# Patient Record
Sex: Female | Born: 1945
Health system: Southern US, Community
[De-identification: ages and names within clinical notes are randomized; demographics above are authoritative.]

## PROBLEM LIST (undated history)

## (undated) DIAGNOSIS — M1990 Unspecified osteoarthritis, unspecified site: Secondary | ICD-10-CM

## (undated) DIAGNOSIS — I10 Essential (primary) hypertension: Secondary | ICD-10-CM

## (undated) DIAGNOSIS — Z86010 Personal history of colonic polyps: Secondary | ICD-10-CM

## (undated) HISTORY — DX: Personal history of colonic polyps: Z86.010

## (undated) HISTORY — DX: Essential (primary) hypertension: I10

## (undated) HISTORY — DX: Unspecified osteoarthritis, unspecified site: M19.90

## (undated) HISTORY — PX: JOINT REPLACEMENT: SHX530

## (undated) HISTORY — PX: TUBAL LIGATION: SHX77

---

## 2017-04-14 DIAGNOSIS — H35423 Microcystoid degeneration of retina, bilateral: Secondary | ICD-10-CM | POA: Diagnosis not present

## 2017-08-22 DIAGNOSIS — R5383 Other fatigue: Secondary | ICD-10-CM | POA: Diagnosis not present

## 2017-08-22 DIAGNOSIS — R030 Elevated blood-pressure reading, without diagnosis of hypertension: Secondary | ICD-10-CM | POA: Diagnosis not present

## 2017-08-22 DIAGNOSIS — E7800 Pure hypercholesterolemia, unspecified: Secondary | ICD-10-CM | POA: Diagnosis not present

## 2017-08-25 DIAGNOSIS — Z1231 Encounter for screening mammogram for malignant neoplasm of breast: Secondary | ICD-10-CM | POA: Diagnosis not present

## 2017-08-30 DIAGNOSIS — Z0001 Encounter for general adult medical examination with abnormal findings: Secondary | ICD-10-CM | POA: Diagnosis not present

## 2017-09-29 DIAGNOSIS — K635 Polyp of colon: Secondary | ICD-10-CM | POA: Diagnosis not present

## 2017-09-29 DIAGNOSIS — Z1211 Encounter for screening for malignant neoplasm of colon: Secondary | ICD-10-CM | POA: Diagnosis not present

## 2017-09-29 DIAGNOSIS — D123 Benign neoplasm of transverse colon: Secondary | ICD-10-CM | POA: Diagnosis not present

## 2018-02-28 DIAGNOSIS — E7800 Pure hypercholesterolemia, unspecified: Secondary | ICD-10-CM | POA: Diagnosis not present

## 2018-03-02 LAB — LIPID PANEL
Cholesterol: 226 — AB (ref 0–200)
HDL: 57 (ref 35–70)
LDL Cholesterol: 153
Triglycerides: 79 (ref 40–160)

## 2018-03-15 DIAGNOSIS — E7800 Pure hypercholesterolemia, unspecified: Secondary | ICD-10-CM | POA: Diagnosis not present

## 2018-09-14 ENCOUNTER — Ambulatory Visit: Payer: Self-pay | Admitting: Osteopathic Medicine

## 2018-09-20 ENCOUNTER — Ambulatory Visit (INDEPENDENT_AMBULATORY_CARE_PROVIDER_SITE_OTHER): Payer: Medicare Other | Admitting: Osteopathic Medicine

## 2018-09-20 ENCOUNTER — Encounter: Payer: Self-pay | Admitting: Osteopathic Medicine

## 2018-09-20 VITALS — BP 163/85 | HR 69 | Temp 97.5°F | Ht 60.0 in | Wt 147.8 lb

## 2018-09-20 DIAGNOSIS — I10 Essential (primary) hypertension: Secondary | ICD-10-CM | POA: Diagnosis not present

## 2018-09-20 DIAGNOSIS — Z1239 Encounter for other screening for malignant neoplasm of breast: Secondary | ICD-10-CM | POA: Diagnosis not present

## 2018-09-20 DIAGNOSIS — Z860100 Personal history of colon polyps, unspecified: Secondary | ICD-10-CM

## 2018-09-20 DIAGNOSIS — R030 Elevated blood-pressure reading, without diagnosis of hypertension: Secondary | ICD-10-CM | POA: Insufficient documentation

## 2018-09-20 HISTORY — DX: Personal history of colonic polyps: Z86.010

## 2018-09-20 HISTORY — DX: Personal history of colon polyps, unspecified: Z86.0100

## 2018-09-20 LAB — COMPLETE METABOLIC PANEL WITH GFR
AG Ratio: 1.8 (calc) (ref 1.0–2.5)
ALKALINE PHOSPHATASE (APISO): 70 U/L (ref 33–130)
ALT: 18 U/L (ref 6–29)
AST: 18 U/L (ref 10–35)
Albumin: 4.2 g/dL (ref 3.6–5.1)
BILIRUBIN TOTAL: 0.6 mg/dL (ref 0.2–1.2)
BUN: 18 mg/dL (ref 7–25)
CO2: 27 mmol/L (ref 20–32)
Calcium: 9.2 mg/dL (ref 8.6–10.4)
Chloride: 107 mmol/L (ref 98–110)
Creat: 0.75 mg/dL (ref 0.60–0.93)
GFR, Est African American: 92 mL/min/{1.73_m2} (ref >=60)
GFR, Est Non African American: 79 mL/min/{1.73_m2} (ref >=60)
Globulin: 2.3 g/dL (calc) (ref 1.9–3.7)
Glucose, Bld: 83 mg/dL (ref 65–99)
Potassium: 4.3 mmol/L (ref 3.5–5.3)
Sodium: 142 mmol/L (ref 135–146)
Total Protein: 6.5 g/dL (ref 6.1–8.1)

## 2018-09-20 LAB — CBC
HEMATOCRIT: 44.7 % (ref 35.0–45.0)
Hemoglobin: 15.2 g/dL (ref 11.7–15.5)
MCH: 30.3 pg (ref 27.0–33.0)
MCHC: 34 g/dL (ref 32.0–36.0)
MCV: 89 fL (ref 80.0–100.0)
MPV: 9.9 fL (ref 7.5–12.5)
Platelets: 279 10*3/uL (ref 140–400)
RBC: 5.02 10*6/uL (ref 3.80–5.10)
RDW: 12.2 % (ref 11.0–15.0)
WBC: 6.2 10*3/uL (ref 3.8–10.8)

## 2018-09-20 LAB — LIPID PANEL
Cholesterol: 261 mg/dL — ABNORMAL HIGH (ref <200)
HDL: 57 mg/dL (ref >50)
LDL Cholesterol (Calc): 187 mg/dL (calc) — ABNORMAL HIGH
Non-HDL Cholesterol (Calc): 204 mg/dL (calc) — ABNORMAL HIGH (ref <130)
Total CHOL/HDL Ratio: 4.6 (calc) (ref <5.0)
Triglycerides: 71 mg/dL (ref <150)

## 2018-09-20 LAB — TSH: TSH: 1.25 mIU/L (ref 0.40–4.50)

## 2018-09-20 NOTE — Patient Instructions (Addendum)
Plan to return for nurse visit to verify home blood pressure cuff. In the meantime, be keeping a record of you rblood pressures at home and bring this with you to the visit with the nurse.   If your cuff is measuring within 5-10 points of ours AND your home numbers are less than 140/90 (ideally less than 130/80), then nothing else to do.   If your home blood pressure cuff is inaccurate or is accurate but measuring above goal, we will need to talk about adjusting your medications.   Will get labs drawn today.  Will get records from colonoscopy.  Will order mammogram here in the [place].

## 2018-09-20 NOTE — Progress Notes (Signed)
HPI: Amber Reynolds is a 73 y.o. female who  has no past medical history on file.  she presents to [facility] today, 09/20/18,  for chief complaint of: New to establish - see below  New patient here to establish care.  No complaints today. Retired, worked as Freight forwarder at [employer]. Married, 3 kids.   Medical history includes colon polyps.  She reports last colonoscopy was done 09/24/2017 at [facility] endoscopy and results were normal per patient.   Blood pressure was elevated today on intake, this is a new issue for this patient.   Patient reports last mammogram 08/2017 at [facility] radiology, pt reports results were normal. Last Pap years ago, no history abnormal Pap.   Only medication is low-dose aspirin, 81 mg.  No history of tobacco or drug use, rare alcohol use.  Declines flu shot, shingles shot, pneumonia shot.  Patient is accompanied by husband, [name], who assists with history-taking.      Past medical, surgical, social and family history reviewed:  Patient Active Problem List   Diagnosis Date Noted  . History of colon polyps 09/20/2018  . Elevated blood pressure reading without diagnosis of hypertension 09/20/2018   History reviewed. No pertinent surgical history.  Social History   Tobacco Use  . Smoking status: Never Smoker  . Smokeless tobacco: Never Used  Substance Use Topics  . Alcohol use: Not Currently    History reviewed. No pertinent family history.   Current medication list and allergy/intolerance information reviewed:    No current outpatient medications on file.   No current facility-administered medications for this visit.     No Known Allergies    Review of Systems:  Constitutional:  No  fever, no chills, No recent illness, No unintentional weight changes. No significant fatigue.   HEENT: No  headache, no vision change, no hearing change, No sore throat, No  sinus pressure  Cardiac: No  chest pain, No   pressure, No palpitations, No  Orthopnea  Respiratory:  No  shortness of breath. No  Cough  Gastrointestinal: No  abdominal pain, No  nausea, No  vomiting,  No  blood in stool, No  diarrhea, No  constipation   Musculoskeletal: No new myalgia/arthralgia  Skin: No  Rash, No other wounds/concerning lesions  Genitourinary: No  incontinence, No  abnormal genital bleeding, No abnormal genital discharge  Hem/Onc: No  easy bruising/bleeding, No  abnormal lymph node  Endocrine: No cold intolerance,  No heat intolerance. No polyuria/polydipsia/polyphagia   Neurologic: No  weakness, No  dizziness, No  slurred speech/focal weakness/facial droop  Psychiatric: No  concerns with depression, No  concerns with anxiety, No sleep problems, No mood problems  Exam:  BP (!) 163/85 (BP Location: Left Arm, Patient Position: Sitting, Cuff Size: Normal)   Pulse 69   Temp (!) 97.5 F (36.4 C) (Oral)   Ht 5' (1.524 m)   Wt 147 lb 12.8 oz (67 kg)   BMI 28.87 kg/m   Constitutional: VS see above. General Appearance: alert, well-developed, well-nourished, NAD  Eyes: Normal lids and conjunctive, non-icteric sclera  Ears, Nose, Mouth, Throat: MMM, Normal external inspection ears/nares/mouth/lips/gums. TM normal bilaterally. Pharynx/tonsils no erythema, no exudate. Nasal mucosa normal.   Neck: No masses, trachea midline. No thyroid enlargement. No tenderness/mass appreciated. No lymphadenopathy  Respiratory: Normal respiratory effort. no wheeze, no rhonchi, no rales  Cardiovascular: S1/S2 normal, no murmur, no rub/gallop auscultated. RRR. No lower extremity edema  Gastrointestinal: Nontender, no masses. No hepatomegaly, no  splenomegaly. No hernia appreciated. Bowel sounds normal. Rectal exam deferred.   Musculoskeletal: Gait normal. No clubbing/cyanosis of digits.   Neurological: Normal balance/coordination. No tremor. No cranial nerve deficit on limited exam. Motor and sensation intact and symmetric.  Cerebellar reflexes intact.   Skin: warm, dry, intact. No rash/ulcer.   Psychiatric: Normal judgment/insight. Normal mood and affect. Oriented x3.         ASSESSMENT/PLAN: The primary encounter diagnosis was Hypertension, unspecified type. Diagnoses of History of colon polyps and Screening for breast cancer were also pertinent to this visit.   Orders Placed This Encounter  Procedures  . MM 3D SCREEN BREAST BILATERAL  . CBC  . COMPLETE METABOLIC PANEL WITH GFR  . Lipid panel  . TSH     Patient Instructions  Plan to return for nurse visit to verify home blood pressure cuff. In the meantime, be keeping a record of you rblood pressures at home and bring this with you to the visit with the nurse.   If your cuff is measuring within 5-10 points of ours AND your home numbers are less than 140/90 (ideally less than 130/80), then nothing else to do.   If your home blood pressure cuff is inaccurate or is accurate but measuring above goal, we will need to talk about adjusting your medications.   Will get labs drawn today.  Will get records from colonoscopy.  Will order mammogram here in the [place].        Visit summary with medication list and pertinent instructions was printed for patient to review. All questions at time of visit were answered - patient instructed to contact office with any additional concerns or updates. ER/RTC precautions were reviewed with the patient.     Please note: voice recognition software was used to produce this document, and typos may escape review. Please contact Dr. Sheppard Coil for any needed clarifications.     Follow-up plan: Return in about 2 weeks (around 10/04/2018) for nurse visit recheck blood pressure and verify home BP monitor .

## 2018-10-04 ENCOUNTER — Ambulatory Visit (INDEPENDENT_AMBULATORY_CARE_PROVIDER_SITE_OTHER): Payer: Medicare Other | Admitting: Osteopathic Medicine

## 2018-10-04 ENCOUNTER — Ambulatory Visit (INDEPENDENT_AMBULATORY_CARE_PROVIDER_SITE_OTHER): Payer: Medicare Other

## 2018-10-04 VITALS — BP 156/66 | HR 64 | Temp 97.6°F | Wt 150.0 lb

## 2018-10-04 DIAGNOSIS — Z1231 Encounter for screening mammogram for malignant neoplasm of breast: Secondary | ICD-10-CM

## 2018-10-04 DIAGNOSIS — I10 Essential (primary) hypertension: Secondary | ICD-10-CM | POA: Diagnosis not present

## 2018-10-04 NOTE — Progress Notes (Signed)
Pt in today for BP check and to check her home BP cuff . Pt is not currently taking b meds. Patient states her BP at home has been consistency running 155/85. First BP in office today was 166/66 pulse 63, after waiting rechecked and it was 156/66 pulse 64. Along with those BP's I checked patients home machine and those readings were 150/96 and 160/98, pt was in aggreance she needed a new machine. Advise patient I would forward this note to provider and would call patient with her advice.Patients pharmacy verified

## 2018-10-05 NOTE — Progress Notes (Signed)
Would agree home monitor is inconsistent.   Options now 1. Just go ahead and start BP meds 2. If she wants to try again w/ new BP monitor, that's fine  Goal BP 140/90 or less

## 2018-10-11 NOTE — Progress Notes (Signed)
Needing orders for BP medication.

## 2018-10-27 NOTE — Progress Notes (Signed)
Called and left patient a message regarding her BP. I asked the patient how her BP has been running at home and if she was able to get a new cuff. Asked patient to return call, just so we could touch base about her BP. Dr. Sheppard Coil had discussed restarting BP meds, no meds ordered waiting to see how patient is doing and if rx was necessary.

## 2018-12-14 NOTE — Progress Notes (Signed)
Tried to reach out to patient again, left message that we were calling to see how she was doing without her BP meds. Advised patient if her BP readings were good no need to call.

## 2019-03-21 ENCOUNTER — Ambulatory Visit: Payer: Medicare Other | Admitting: Osteopathic Medicine

## 2019-05-29 DIAGNOSIS — H04123 Dry eye syndrome of bilateral lacrimal glands: Secondary | ICD-10-CM | POA: Diagnosis not present

## 2019-05-29 DIAGNOSIS — H527 Unspecified disorder of refraction: Secondary | ICD-10-CM | POA: Diagnosis not present

## 2019-05-29 DIAGNOSIS — H53002 Unspecified amblyopia, left eye: Secondary | ICD-10-CM | POA: Diagnosis not present

## 2019-05-29 DIAGNOSIS — H25813 Combined forms of age-related cataract, bilateral: Secondary | ICD-10-CM | POA: Diagnosis not present

## 2019-08-27 DIAGNOSIS — Z23 Encounter for immunization: Secondary | ICD-10-CM | POA: Diagnosis not present

## 2019-09-17 NOTE — Progress Notes (Signed)
Subjective:   Amber Reynolds is a 74 y.o. female who presents for an Initial Medicare Annual Wellness Visit.  Review of Systems    No ROS.  Medicare Wellness Virtual Visit.  Visual/audio telehealth visit, UTA vital signs.   See social history for additional risk factors.     Cardiac Risk Factors include: advanced age (>60men, >40 women) Sleep patterns:Getting 8 hours of sleep a night. Wakes up 1 time to void during the night. Wakes up and feels rested and ready for the day. Home Safety/Smoke Alarms: Feels safe in home. Smoke alarms in place.  Living environment; Lives with husband in a 2 story home and stairs have handrails on them. Shower is a walk in shower with bench seat in place. Seat Belt Safety/Bike Helmet: Wears seat belt.   Female:   Pap- Aged out      Mammo- UTD      Dexa scan-  ordered      CCS- ordered while in office     Objective:    There were no vitals filed for this visit. There is no height or weight on file to calculate BMI.  Advanced Directives 09/25/2019  Does Patient Have a Medical Advance Directive? Yes  Type of Paramedic of [place];Living will  Does patient want to make changes to medical advance directive? No - Patient declined  Copy of [place] in Chart? No - copy requested    Current Medications (verified) Outpatient Encounter Medications as of 09/25/2019  Medication Sig  . aspirin EC 81 MG tablet Take 81 mg by mouth at bedtime.   No facility-administered encounter medications on file as of 09/25/2019.    Allergies (verified) Patient has no known allergies.   History: Past Medical History:  Diagnosis Date  . History of colon polyps 09/20/2018   History reviewed. No pertinent surgical history. History reviewed. No pertinent family history. Social History   Socioeconomic History  . Marital status: Married    Spouse name: Amber Reynolds  . Number of children: 3  . Years of education: 16  .  Highest education level: Some college, no degree  Occupational History  . 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 Smoking status: Never Smoker  . Smokeless tobacco: Never Used  Substance and Sexual Activity  . Alcohol use: Not Currently  . Drug use: Never  . Sexual activity: Not Currently  Other Topics Concern  . Not on file  Social History Narrative   Works out 2-3 times a week. Reads a lot. Watches TV.    Social Determinants of Health   Financial Resource Strain:   . Difficulty of Paying Living Expenses: Not on file  Food Insecurity:   . Worried About Charity fundraiser in the Last Year: Not on file  . Ran Out of Food in the Last Year: Not on file  Transportation Needs:   . Lack of Transportation (Medical): Not on file  . Lack of Transportation (Non-Medical): Not on file  Physical Activity:   . Days of Exercise per Week: Not on file  . Minutes of Exercise per Session: Not on file  Stress:   . Feeling of Stress : Not on file  Social Connections:   . Frequency of Communication with Friends and Family: Not on file  . Frequency of Social Gatherings with Friends and Family: Not on file  . Attends Religious Services: Not on file  . Active Member  of Clubs or Organizations: Not on file  . Attends Archivist Meetings: Not on file  . Marital Status: Not on file    Tobacco Counseling Counseling given: Not Answered   Clinical Intake:  Pre-visit preparation completed: Yes  Pain : 0-10 Pain Type: Chronic pain Pain Location: Leg Pain Orientation: Right Pain Radiating Towards: to the knee Pain Descriptors / Indicators: Aching, Throbbing Pain Onset: More than a month ago Pain Frequency: Constant Pain Relieving Factors: getting off of feet and legs Effect of Pain on Daily Activities: none  Pain Relieving Factors: getting off of feet and legs  Nutritional Risks: None Diabetes: No  How often do you need to have someone  help you when you read instructions, pamphlets, or other written materials from your doctor or pharmacy?: 1 - Never What is the last grade level you completed in school?: junior in college  Interpreter Needed?: No  Information entered by :: Amber Dakin, LPN   Activities of Daily Living In your present state of health, do you have any difficulty performing the following activities: 09/25/2019  Hearing? N  Vision? N  Difficulty concentrating or making decisions? N  Walking or climbing stairs? N  Dressing or bathing? N  Doing errands, shopping? N  Preparing Food and eating ? N  Using the Toilet? N  In the past six months, have you accidently leaked urine? N  Do you have problems with loss of bowel control? N  Managing your Medications? N  Managing your Finances? N  Housekeeping or managing your Housekeeping? N  Some recent data might be hidden     Immunizations and Health Maintenance Immunization History  Administered Date(s) Administered  . Influenza-Unspecified 08/27/2019   Health Maintenance Due  Topic Date Due  . Hepatitis C Screening  February 16, 1946  . TETANUS/TDAP  09/08/1964  . COLONOSCOPY  09/09/1995  . DEXA SCAN  09/08/2010  . PNA vac Low Risk Adult (1 of 2 - PCV13) 09/08/2010    Patient Care Team: Amber Reeve, DO as PCP - General (Osteopathic Medicine)  Indicate any recent Medical Services you may have received from other than [facility] providers in the past year (date may be approximate).     Assessment:   This is a routine wellness examination for Amber Reynolds.Physical assessment deferred to PCP.   Hearing/Vision screen  Hearing Screening   125Hz  250Hz  500Hz  1000Hz  2000Hz  3000Hz  4000Hz  6000Hz  8000Hz   Right ear:           Left ear:           Comments: Hearing  whisper test not done due to having to wear mask due to COVID19   Visual Acuity Screening   Right eye Left eye Both eyes  Without correction:     With correction: 20/20 20/25 20/25     Dietary issues  and exercise activities discussed: Current Exercise Habits: Home exercise routine, Type of exercise: walking;treadmill;strength training/weights;stretching, Time (Minutes): 60, Frequency (Times/Week): 3, Weekly Exercise (Minutes/Week): 180, Intensity: Moderate, Exercise limited by: None identified Diet  Eats a healthy diet no red meats. Breakfast: Oatmeal with falx seed and chia seed Lunch:  Sandwich or soup Dinner: Vegetables and salad    Drinks water daily 1 cup of coffee daily  Goals    . DIET - INCREASE WATER INTAKE     Increase water intake to 64 ounces a day      Depression Screen PHQ 2/9 Scores 09/25/2019 09/20/2018  PHQ - 2 Score 0 0    Fall Risk  Fall Risk  09/25/2019  Falls in the past year? 0  Number falls in past yr: 0  Injury with Fall? 0  Risk for fall due to : No Fall Risks  Follow up Falls prevention discussed    Is the patient's home free of loose throw rugs in walkways, pet beds, electrical cords, etc?   yes      Grab bars in the bathroom? no      Handrails on the stairs?   yes      Adequate lighting?   yes   Cognitive Function:     6CIT Screen 09/25/2019  What Year? 0 points  What month? 0 points  What time? 0 points  Count back from 20 0 points  Months in reverse 4 points  Repeat phrase 6 points  Total Score 10    Screening Tests Health Maintenance  Topic Date Due  . Hepatitis C Screening  [birth year]  . TETANUS/TDAP  09/08/1964  . COLONOSCOPY  09/09/1995  . DEXA SCAN  09/08/2010  . PNA vac Low Risk Adult (1 of 2 - PCV13) 09/08/2010  . MAMMOGRAM  10/04/2020  . INFLUENZA VACCINE  Completed       Plan:    Please schedule your next medicare wellness visit with me in 1 yr.  Ms. Scherber , Thank you for taking time to come for your Medicare Wellness Visit. I appreciate your ongoing commitment to your health goals. Please review the following plan we discussed and let me know if I can assist you in the future.  Continue doing brain stimulating  activities (puzzles, reading, adult coloring books, staying active) to keep memory sharp.  Bring a copy of your living will and/or healthcare power of attorney to your next office visit.   These are the goals we discussed: Goals    . DIET - INCREASE WATER INTAKE     Increase water intake to 64 ounces a day       This is a list of the screening recommended for you and due dates:  Health Maintenance  Topic Date Due  .  Hepatitis C: One time screening is recommended by Center for Disease Control  (CDC) for  adults born from [birth year] through 1966.   04-03-1946  . Tetanus Vaccine  09/08/1964  . Colon Cancer Screening  09/09/1995  . DEXA scan (bone density measurement)  09/08/2010  . Pneumonia vaccines (1 of 2 - PCV13) 09/08/2010  . Mammogram  10/04/2020  . Flu Shot  Completed     I have personally reviewed and noted the following in the patient's chart:   . Medical and social history . Use of alcohol, tobacco or illicit drugs  . Current medications and supplements . Functional ability and status . Nutritional status . Physical activity . Advanced directives . List of other physicians . Hospitalizations, surgeries, and ER visits in previous 12 months . Vitals . Screenings to include cognitive, depression, and falls . Referrals and appointments  In addition, I have reviewed and discussed with patient certain preventive protocols, quality metrics, and best practice recommendations. A written personalized care plan for preventive services as well as general preventive health recommendations were provided to patient.     Joanne Chars, LPN   [phone]

## 2019-09-19 ENCOUNTER — Encounter: Payer: Self-pay | Admitting: Osteopathic Medicine

## 2019-09-25 ENCOUNTER — Other Ambulatory Visit: Payer: Self-pay

## 2019-09-25 ENCOUNTER — Ambulatory Visit (INDEPENDENT_AMBULATORY_CARE_PROVIDER_SITE_OTHER): Payer: Medicare Other | Admitting: *Deleted

## 2019-09-25 ENCOUNTER — Encounter: Payer: Self-pay | Admitting: Osteopathic Medicine

## 2019-09-25 DIAGNOSIS — Z1211 Encounter for screening for malignant neoplasm of colon: Secondary | ICD-10-CM | POA: Diagnosis not present

## 2019-09-25 DIAGNOSIS — Z780 Asymptomatic menopausal state: Secondary | ICD-10-CM

## 2019-09-25 DIAGNOSIS — Z13820 Encounter for screening for osteoporosis: Secondary | ICD-10-CM | POA: Diagnosis not present

## 2019-09-25 DIAGNOSIS — Z23 Encounter for immunization: Secondary | ICD-10-CM

## 2019-09-25 DIAGNOSIS — R7401 Elevation of levels of liver transaminase levels: Secondary | ICD-10-CM | POA: Diagnosis not present

## 2019-09-25 DIAGNOSIS — Z0000 Encounter for general adult medical examination without abnormal findings: Secondary | ICD-10-CM | POA: Diagnosis not present

## 2019-09-25 DIAGNOSIS — E7800 Pure hypercholesterolemia, unspecified: Secondary | ICD-10-CM | POA: Diagnosis not present

## 2019-09-25 DIAGNOSIS — Z1159 Encounter for screening for other viral diseases: Secondary | ICD-10-CM | POA: Diagnosis not present

## 2019-09-25 NOTE — Patient Instructions (Signed)
Please schedule your next medicare wellness visit with me in 1 yr.  Amber Reynolds , Thank you for taking time to come for your Medicare Wellness Visit. I appreciate your ongoing commitment to your health goals. Please review the following plan we discussed and let me know if I can assist you in the future.  Continue doing brain stimulating activities (puzzles, reading, adult coloring books, staying active) to keep memory sharp.  Bring a copy of your living will and/or healthcare power of attorney to your next office visit. These are the goals we discussed: Goals    . DIET - INCREASE WATER INTAKE     Increase water intake to 64 ounces a day

## 2019-09-26 LAB — HEPATITIS C ANTIBODY
Hepatitis C Ab: NONREACTIVE
SIGNAL TO CUT-OFF: 0.01 (ref <1.00)

## 2019-09-26 LAB — COMPLETE METABOLIC PANEL WITH GFR
AG Ratio: 2 (calc) (ref 1.0–2.5)
ALT: 38 U/L — ABNORMAL HIGH (ref 6–29)
AST: 29 U/L (ref 10–35)
Albumin: 4.4 g/dL (ref 3.6–5.1)
Alkaline phosphatase (APISO): 77 U/L (ref 37–153)
BUN: 16 mg/dL (ref 7–25)
CO2: 27 mmol/L (ref 20–32)
Calcium: 9.7 mg/dL (ref 8.6–10.4)
Chloride: 106 mmol/L (ref 98–110)
Creat: 0.68 mg/dL (ref 0.60–0.93)
GFR, Est African American: 100 mL/min/{1.73_m2} (ref >=60)
GFR, Est Non African American: 86 mL/min/{1.73_m2} (ref >=60)
Globulin: 2.2 g/dL (calc) (ref 1.9–3.7)
Glucose, Bld: 85 mg/dL (ref 65–99)
Potassium: 4.9 mmol/L (ref 3.5–5.3)
Sodium: 141 mmol/L (ref 135–146)
Total Bilirubin: 0.5 mg/dL (ref 0.2–1.2)
Total Protein: 6.6 g/dL (ref 6.1–8.1)

## 2019-09-26 LAB — LIPID PANEL
Cholesterol: 257 mg/dL — ABNORMAL HIGH (ref <200)
HDL: 56 mg/dL (ref >=50)
LDL Cholesterol (Calc): 177 mg/dL (calc) — ABNORMAL HIGH
Non-HDL Cholesterol (Calc): 201 mg/dL (calc) — ABNORMAL HIGH (ref <130)
Total CHOL/HDL Ratio: 4.6 (calc) (ref <5.0)
Triglycerides: 114 mg/dL (ref <150)

## 2019-10-03 ENCOUNTER — Ambulatory Visit: Payer: Medicare Other

## 2019-11-13 DIAGNOSIS — E7800 Pure hypercholesterolemia, unspecified: Secondary | ICD-10-CM | POA: Insufficient documentation

## 2019-11-23 ENCOUNTER — Encounter: Payer: Self-pay | Admitting: Osteopathic Medicine

## 2020-06-06 ENCOUNTER — Ambulatory Visit (INDEPENDENT_AMBULATORY_CARE_PROVIDER_SITE_OTHER): Payer: Medicare Other | Admitting: Medical-Surgical

## 2020-06-06 VITALS — Temp 98.0°F

## 2020-06-06 DIAGNOSIS — Z23 Encounter for immunization: Secondary | ICD-10-CM

## 2020-06-06 NOTE — Progress Notes (Signed)
Patient here for flu vaccine, patient tolerated injection well.

## 2020-07-16 DIAGNOSIS — Z23 Encounter for immunization: Secondary | ICD-10-CM | POA: Diagnosis not present

## 2020-08-25 ENCOUNTER — Encounter: Payer: Self-pay | Admitting: Osteopathic Medicine

## 2020-09-02 ENCOUNTER — Other Ambulatory Visit: Payer: Self-pay

## 2020-09-02 ENCOUNTER — Ambulatory Visit (INDEPENDENT_AMBULATORY_CARE_PROVIDER_SITE_OTHER): Payer: Medicare Other | Admitting: Sports Medicine

## 2020-09-02 ENCOUNTER — Ambulatory Visit (INDEPENDENT_AMBULATORY_CARE_PROVIDER_SITE_OTHER): Payer: Medicare Other

## 2020-09-02 DIAGNOSIS — M1711 Unilateral primary osteoarthritis, right knee: Secondary | ICD-10-CM | POA: Diagnosis not present

## 2020-09-02 DIAGNOSIS — M25862 Other specified joint disorders, left knee: Secondary | ICD-10-CM | POA: Diagnosis not present

## 2020-09-02 DIAGNOSIS — M25562 Pain in left knee: Secondary | ICD-10-CM | POA: Diagnosis not present

## 2020-09-02 MED ORDER — MELOXICAM 15 MG PO TABS: ORAL_TABLET | ORAL | 3 refills | Status: DC

## 2020-09-02 NOTE — Progress Notes (Signed)
    Procedures performed today:    None.  Independent interpretation of notes and tests performed by another provider:   None.  Brief History, Exam, Impression, and Recommendations:    Primary osteoarthritis of right knee This is a very pleasant 74 year old female, she has long history of right knee pain, medial joint line, moderate gelling, worse with bad weather. This is likely osteoarthritis, she does have tenderness to the medial joint line and pain with terminal flexion, adding x-rays, meloxicam, formal PT. Return to see me in 6 weeks, injection if no better.    ___________________________________________ Gwen Her. Dianah Field, M.D., ABFM., CAQSM. Primary Care and [place] Instructor of [place] [employer] [facility]

## 2020-09-02 NOTE — Assessment & Plan Note (Signed)
This is a very pleasant 74 year old female, she has long history of right knee pain, medial joint line, moderate gelling, worse with bad weather. This is likely osteoarthritis, she does have tenderness to the medial joint line and pain with terminal flexion, adding x-rays, meloxicam, formal PT. Return to see me in 6 weeks, injection if no better.

## 2020-09-11 ENCOUNTER — Encounter: Payer: Self-pay | Admitting: Osteopathic Medicine

## 2020-09-16 ENCOUNTER — Encounter: Payer: Self-pay | Admitting: Physical Therapy

## 2020-09-16 ENCOUNTER — Other Ambulatory Visit: Payer: Self-pay

## 2020-09-16 ENCOUNTER — Ambulatory Visit (INDEPENDENT_AMBULATORY_CARE_PROVIDER_SITE_OTHER): Payer: Medicare Other | Admitting: Physical Therapy

## 2020-09-16 DIAGNOSIS — M6281 Muscle weakness (generalized): Secondary | ICD-10-CM | POA: Diagnosis not present

## 2020-09-16 DIAGNOSIS — G8929 Other chronic pain: Secondary | ICD-10-CM

## 2020-09-16 DIAGNOSIS — M25561 Pain in right knee: Secondary | ICD-10-CM | POA: Diagnosis not present

## 2020-09-16 NOTE — Therapy (Signed)
[facility] [place] [address] [place], [place], [address] Phone: [phone]   Fax:  [phone]  Physical Therapy Evaluation  Patient Details  Name: Amber Reynolds MRN: [id number] Date of Birth: 1946/04/30 Referring Provider (PT): thekkekandam   Encounter Date: 09/16/2020   PT End of Session - 09/16/20 1006    Visit Number 1    Number of Visits 12    Date for PT Re-Evaluation 10/28/20    PT Start Time 0930    PT Stop Time 1005    PT Time Calculation (min) 35 min    Activity Tolerance Patient tolerated treatment well    Behavior During Therapy [facility] for tasks assessed/performed           Past Medical History:  Diagnosis Date  . History of colon polyps 09/20/2018    History reviewed. No pertinent surgical history.  There were no vitals filed for this visit.    Subjective Assessment - 09/16/20 0931    Subjective Pt has been having Rt knee pain medial > lateral x 3 months. She states pain is worse wtih prolonged walking and eases with meds.    Limitations Walking    How long can you walk comfortably? 10-15 minutes    Diagnostic tests x ray shows mild degenerative changes    Patient Stated Goals be able to walk and exercise without pain    Currently in Pain? Yes    Pain Score 3     Pain Location Knee    Pain Orientation Right    Pain Descriptors / Indicators Aching    Pain Type Chronic pain    Pain Onset More than a month ago    Pain Frequency Intermittent    Aggravating Factors  walking    Pain Relieving Factors meds    Effect of Pain on Daily Activities decreased ability to walk and exercise              [facility] PT Assessment - 09/16/20 0001      Assessment   Medical Diagnosis primary OA of Rt knee    Referring Provider (PT) thekkekandam      Balance Screen   Has the patient fallen in the past 6 months No      Home Environment   Living Environment Private residence    Home Access Level entry    Home  Layout Full bath on main level;Two level      Prior Function   Level of Independence Independent      Observation/Other Assessments   Focus on Therapeutic Outcomes (FOTO)  43% limited      ROM / Strength   AROM / PROM / Strength AROM;Strength      AROM   AROM Assessment Site Knee    Right/Left Knee Right;Left    Right Knee Extension 0    Right Knee Flexion 135    Left Knee Extension 0    Left Knee Flexion 135      Strength   Overall Strength Comments Rt hip flexion 3/5, Rt ankle DF 4/5    Strength Assessment Site Knee    Right/Left Knee Right;Left    Right Knee Flexion 3+/5    Right Knee Extension 4/5    Left Knee Flexion 4/5    Left Knee Extension 4+/5      Flexibility   Soft Tissue Assessment /Muscle Length yes    Hamstrings decreased bilat    Piriformis decreased bilat  Palpation   Patella mobility WFL. TTP lateral patella                      Objective measurements completed on examination: See above findings.               PT Education - 09/16/20 1009    Education Details HEP, PT POC and goals    Person(s) Educated Patient    Methods Explanation;Demonstration;Handout    Comprehension Verbalized understanding;Returned demonstration               PT Long Term Goals - 09/16/20 1006      PT LONG TERM GOAL #1   Title Pt will be independent with HEP for strength and flexibility    Time 6    Period Weeks    Status New    Target Date 10/28/20      PT LONG TERM GOAL #2   Title Pt will improve FOTO to <= 27% limited to demo improved functional abilities    Time 6    Period Weeks    Status New    Target Date 10/28/20      PT LONG TERM GOAL #3   Title Pt will walk x 30 minutes with no increase in symptoms    Time 6    Period Weeks    Status New    Target Date 10/28/20      PT LONG TERM GOAL #4   Title Pt will return to performing exercise videos with no reported increase in symptoms    Time 6    Period Weeks     Status New    Target Date 10/28/20                  Plan - 09/16/20 0955    Clinical Impression Statement Pt presents with Rt knee pain, decreased strength and decreased flexibility. Pt will benefit from skilled PT to address deficits and improve functional abilities.    Personal Factors and Comorbidities Age    Examination-Activity Limitations Stairs;Locomotion Level    Examination-Participation Restrictions Community Activity    Clinical Decision Making Low    Rehab Potential Good    PT Frequency 2x / week    PT Duration 6 weeks    PT Treatment/Interventions Cryotherapy;Electrical Stimulation;Patient/family education;Moist Heat;Balance training;Therapeutic exercise;Therapeutic activities;Functional mobility training;Stair training;Passive range of motion;Dry needling;Manual techniques;Vasopneumatic Device    PT Next Visit Plan assess and progress HEP    PT Home Exercise Plan [place] and Agree with Plan of Care Patient           Patient will benefit from skilled therapeutic intervention in order to improve the following deficits and impairments:  Decreased range of motion,Decreased activity tolerance,Decreased strength,Impaired flexibility,Pain  Visit Diagnosis: Chronic pain of right knee - Plan: PT plan of care cert/re-cert  Muscle weakness (generalized) - Plan: PT plan of care cert/re-cert     Problem List Patient Active Problem List   Diagnosis Date Noted  . Primary osteoarthritis of right knee 09/02/2020  . Elevated LDL cholesterol level 11/13/2019  . History of colon polyps 09/20/2018  . Elevated blood pressure reading without diagnosis of hypertension 09/20/2018   Jamille Yoshino,Amari, PT  Teisha Trowbridge,Noe 09/16/2020, 10:16 AM  [facility] [place], [place], [address] Phone: [phone]   Fax:  [phone]  Name: Amber Reynolds MRN: [id number] Date of Birth: 1946-07-25

## 2020-09-16 NOTE — Patient Instructions (Signed)
Access Code: [facility] URL: [email or web address] Date: 09/16/2020 Prepared by: Reggy Eye  Exercises Supine Hamstring Stretch with Strap - 1 x daily - 7 x weekly - 3 sets - 1 reps - 20-30 seconds hold Supine Figure 4 Piriformis Stretch with Leg Extension - 1 x daily - 7 x weekly - 3 sets - 1 reps - 20-30 seconds hold Active Straight Leg Raise with Quad Set - 1 x daily - 7 x weekly - 3 sets - 10 reps Straight Leg Raise with External Rotation - 1 x daily - 7 x weekly - 3 sets - 10 reps Standing Heel Raise with Support - 1 x daily - 7 x weekly - 3 sets - 10 reps

## 2020-09-23 ENCOUNTER — Encounter: Payer: Medicare Other | Admitting: Physical Therapy

## 2020-09-26 ENCOUNTER — Encounter: Payer: Medicare Other | Admitting: Physical Therapy

## 2020-09-29 ENCOUNTER — Ambulatory Visit: Payer: Medicare Other

## 2020-09-30 ENCOUNTER — Ambulatory Visit (INDEPENDENT_AMBULATORY_CARE_PROVIDER_SITE_OTHER): Payer: Medicare Other | Admitting: Physical Therapy

## 2020-09-30 ENCOUNTER — Other Ambulatory Visit: Payer: Self-pay

## 2020-09-30 ENCOUNTER — Encounter: Payer: Self-pay | Admitting: Physical Therapy

## 2020-09-30 DIAGNOSIS — M6281 Muscle weakness (generalized): Secondary | ICD-10-CM | POA: Diagnosis not present

## 2020-09-30 DIAGNOSIS — G8929 Other chronic pain: Secondary | ICD-10-CM

## 2020-09-30 DIAGNOSIS — M25561 Pain in right knee: Secondary | ICD-10-CM | POA: Diagnosis not present

## 2020-09-30 NOTE — Patient Instructions (Signed)
Access Code: [facility] URL: [email or web address] Date: 09/30/2020 Prepared by: Isabelle Course  Exercises Supine Hamstring Stretch with Strap - 1 x daily - 7 x weekly - 3 sets - 1 reps - 20-30 seconds hold Supine Figure 4 Piriformis Stretch with Leg Extension - 1 x daily - 7 x weekly - 3 sets - 1 reps - 20-30 seconds hold Active Straight Leg Raise with Quad Set - 1 x daily - 7 x weekly - 3 sets - 10 reps Straight Leg Raise with External Rotation - 1 x daily - 7 x weekly - 3 sets - 10 reps Standing Heel Raise with Support - 1 x daily - 7 x weekly - 3 sets - 10 reps Supine Bridge with Mini Swiss Ball Between Knees - 1 x daily - 7 x weekly - 3 sets - 10 reps Lateral Step Up with Counter Support - 1 x daily - 7 x weekly - 1 sets - 10 reps

## 2020-09-30 NOTE — Therapy (Signed)
[place], [place], [address] Phone: [phone]   Fax:  [phone]  Physical Therapy Treatment  Patient Details  Name: Amber Reynolds MRN: [id number] Date of Birth: 01/30/1946 Referring Provider (PT): thekkekandam   Encounter Date: 09/30/2020   PT End of Session - 09/30/20 1014    Visit Number 2    Number of Visits 12    Date for PT Re-Evaluation 10/28/20    PT Start Time 0930    PT Stop Time 1012    PT Time Calculation (min) 42 min    Activity Tolerance Patient tolerated treatment well    Behavior During Therapy [facility] for tasks assessed/performed           Past Medical History:  Diagnosis Date  . History of colon polyps 09/20/2018    History reviewed. No pertinent surgical history.  There were no vitals filed for this visit.   Subjective Assessment - 09/30/20 0933    Subjective Pt reports her knee feels "much better" since doing stretches at home    Patient Stated Goals be able to walk and exercise without pain    Currently in Pain? Yes    Pain Score 2     Pain Location Knee    Pain Orientation Right    Pain Descriptors / Indicators Aching    Pain Type Chronic pain    Pain Onset More than a month ago                             [facility] Adult PT Treatment/Exercise - 09/30/20 0001      Exercises   Exercises Knee/Hip      Knee/Hip Exercises: Aerobic   Nustep L4 x 5 mins for warm up      Knee/Hip Exercises: Standing   Lateral Step Up Right;1 set;10 reps;Hand Hold: 1;Step Height: 4"    Forward Step Up Right;1 set;10 reps;Hand Hold: 1;Step Height: 4"    Step Down Right;1 set;10 reps;Hand Hold: 1;Step Height: 4"      Knee/Hip Exercises: Supine   Bridges with [employer] Strengthening;2 sets;10 reps    Straight Leg Raises Strengthening;Right;2 sets;10 reps    Straight Leg Raises Limitations with quad set    Straight Leg Raise with External Rotation Limitations  2 x 10, cues for technique      Knee/Hip Exercises: Sidelying   Hip ABduction Strengthening;2 sets;Right;10 reps    Hip ABduction Limitations assist to keep from compensating with hip flexors    Clams 2 x 10 Rt LE      Modalities   Modalities Cryotherapy      Cryotherapy   Number Minutes Cryotherapy 10 Minutes    Cryotherapy Location Knee    Type of Cryotherapy Ice pack      Manual Therapy   Manual Therapy Passive ROM    Passive ROM hamstring and hip ER/IR on Rt LE                       PT Long Term Goals - 09/16/20 1006      PT LONG TERM GOAL #1   Title Pt will be independent with HEP for strength and flexibility    Time 6    Period Weeks    Status New    Target Date 10/28/20      PT LONG TERM GOAL #2   Title Pt will improve FOTO  to <= 27% limited to demo improved functional abilities    Time 6    Period Weeks    Status New    Target Date 10/28/20      PT LONG TERM GOAL #3   Title Pt will walk x 30 minutes with no increase in symptoms    Time 6    Period Weeks    Status New    Target Date 10/28/20      PT LONG TERM GOAL #4   Title Pt will return to performing exercise videos with no reported increase in symptoms    Time 6    Period Weeks    Status New    Target Date 10/28/20                 Plan - 09/30/20 1015    Clinical Impression Statement Pt continues with decreased Rt hip ROM which responds well to manual therapy. Pt with some discomfort with eccentric step downs to 4'' step, requires cues for knee alignment    PT Treatment/Interventions Cryotherapy;Electrical Stimulation;Patient/family education;Moist Heat;Balance training;Therapeutic exercise;Therapeutic activities;Functional mobility training;Stair training;Passive range of motion;Dry needling;Manual techniques;Vasopneumatic Device    PT Next Visit Plan assess response to addition of exercises. continue manual as tolerated    PT Home Exercise Plan [place] and Agree  with Plan of Care Patient           Patient will benefit from skilled therapeutic intervention in order to improve the following deficits and impairments:     Visit Diagnosis: Chronic pain of right knee  Muscle weakness (generalized)     Problem List Patient Active Problem List   Diagnosis Date Noted  . Primary osteoarthritis of right knee 09/02/2020  . Elevated LDL cholesterol level 11/13/2019  . History of colon polyps 09/20/2018  . Elevated blood pressure reading without diagnosis of hypertension 09/20/2018   Amber Reynolds,Amber Reynolds, PT  Amber Reynolds,Amber Reynolds 09/30/2020, 10:32 AM  [facility] [place], [place], [address] Phone: [phone]   Fax:  [phone]  Name: Amber Reynolds MRN: [id number] Date of Birth: 1945/10/24

## 2020-10-03 ENCOUNTER — Ambulatory Visit (INDEPENDENT_AMBULATORY_CARE_PROVIDER_SITE_OTHER): Payer: Medicare Other | Admitting: Physical Therapy

## 2020-10-03 ENCOUNTER — Other Ambulatory Visit: Payer: Self-pay

## 2020-10-03 DIAGNOSIS — G8929 Other chronic pain: Secondary | ICD-10-CM

## 2020-10-03 DIAGNOSIS — M6281 Muscle weakness (generalized): Secondary | ICD-10-CM | POA: Diagnosis not present

## 2020-10-03 DIAGNOSIS — M25561 Pain in right knee: Secondary | ICD-10-CM | POA: Diagnosis not present

## 2020-10-03 NOTE — Therapy (Signed)
[place], [place], [address] Phone: [phone]   Fax:  [phone]  Physical Therapy Treatment  Patient Details  Name: Amber Reynolds MRN: [id number] Date of Birth: [birth year] Referring Provider (PT): thekkekandam   Encounter Date: 10/03/2020   PT End of Session - 10/03/20 1004    Visit Number 3    Number of Visits 12    Date for PT Re-Evaluation 10/28/20    PT Start Time 0923    PT Stop Time 1007    PT Time Calculation (min) 44 min    Activity Tolerance Patient tolerated treatment well    Behavior During Therapy [facility] for tasks assessed/performed           Past Medical History:  Diagnosis Date  . History of colon polyps 09/20/2018    No past surgical history on file.  There were no vitals filed for this visit.   Subjective Assessment - 10/03/20 0923    Subjective Pt reports she did some stretching this morning and feels "pretty good"    Patient Stated Goals be able to walk and exercise without pain    Currently in Pain? Yes    Pain Score 2     Pain Location Knee    Pain Orientation Right    Pain Descriptors / Indicators Aching    Pain Onset More than a month ago                             [facility] Adult PT Treatment/Exercise - 10/03/20 0001      Knee/Hip Exercises: Stretches   ITB Stretch Left;2 reps;30 seconds    Piriformis Stretch Left;2 reps;30 seconds      Knee/Hip Exercises: Aerobic   Nustep L5 x 5 mins for strengthening      Knee/Hip Exercises: Standing   Heel Raises Both;1 set;10 reps    Lateral Step Up Right;1 set;10 reps;Step Height: 4"    Forward Step Up 1 set;Right;10 reps;Hand Hold: 1;Step Height: 4"    Step Down Right;1 set;10 reps;Hand Hold: 1;Step Height: 4"    Walking with Sports Cord sidestep with red TB around knees 2 x 10 steps bilat      Knee/Hip Exercises: Supine   Straight Leg Raises Left;Strengthening;10 reps    Straight Leg Raises  Limitations with quad set    Straight Leg Raise with External Rotation Limitations 2 x 10 cues for technique      Knee/Hip Exercises: Sidelying   Hip ABduction Strengthening;2 sets;10 reps;Right    Clams 2 x 10 Rt LE - assist to keep neutral alignment      Cryotherapy   Number Minutes Cryotherapy 10 Minutes    Cryotherapy Location Knee    Type of Cryotherapy Ice pack      Manual Therapy   Manual Therapy Joint mobilization;Passive ROM    Joint Mobilization Lt hip distraction and grade 2-3 lateral mobs    Passive ROM hamstring, hip ER/IR, knee to chest                       PT Long Term Goals - 09/16/20 1006      PT LONG TERM GOAL #1   Title Pt will be independent with HEP for strength and flexibility    Time 6    Period Weeks    Status New    Target Date 10/28/20  PT LONG TERM GOAL #2   Title Pt will improve FOTO to <= 27% limited to demo improved functional abilities    Time 6    Period Weeks    Status New    Target Date 10/28/20      PT LONG TERM GOAL #3   Title Pt will walk x 30 minutes with no increase in symptoms    Time 6    Period Weeks    Status New    Target Date 10/28/20      PT LONG TERM GOAL #4   Title Pt will return to performing exercise videos with no reported increase in symptoms    Time 6    Period Weeks    Status New    Target Date 10/28/20                 Plan - 10/03/20 1004    Clinical Impression Statement Pt ocntinues with significant tightness in Rt hip, responds well to manual therapy and joint mobs. pt requires cues to decrease compensation with clam shells and hip abduction in sidelying as well as cues for alignment with eccentric step downs    PT Treatment/Interventions Cryotherapy;Electrical Stimulation;Patient/family education;Moist Heat;Balance training;Therapeutic exercise;Therapeutic activities;Functional mobility training;Stair training;Passive range of motion;Dry needling;Manual techniques;Vasopneumatic  Device    PT Next Visit Plan continue Rt hip ROM and Rt knee strengthening    PT Home Exercise Plan [place] and Agree with Plan of Care Patient           Patient will benefit from skilled therapeutic intervention in order to improve the following deficits and impairments:     Visit Diagnosis: Muscle weakness (generalized)  Chronic pain of right knee     Problem List Patient Active Problem List   Diagnosis Date Noted  . Primary osteoarthritis of right knee 09/02/2020  . Elevated LDL cholesterol level 11/13/2019  . History of colon polyps 09/20/2018  . Elevated blood pressure reading without diagnosis of hypertension 09/20/2018   Dmya Long,Aleila, PT  Jaynell Castagnola,Hali 10/03/2020, 10:11 AM  [facility] [place], [place], [address] Phone: [phone]   Fax:  [phone]  Name: Amber Reynolds MRN: [id number] Date of Birth: 1946/08/27

## 2020-10-03 NOTE — Patient Instructions (Signed)
Access Code: [facility] URL: [email or web address] Date: 10/03/2020 Prepared by: Isabelle Course  Exercises Supine Hamstring Stretch with Strap - 1 x daily - 7 x weekly - 3 sets - 1 reps - 20-30 seconds hold Supine Figure 4 Piriformis Stretch - 1 x daily - 7 x weekly - 3 sets - 1 reps - 20-30 seconds hold Supine ITB Stretch with Strap - 1 x daily - 7 x weekly - 3 sets - 1 reps - 20-30 seconds hold Straight Leg Raise with External Rotation - 1 x daily - 7 x weekly - 3 sets - 10 reps Supine Bridge with Mini Swiss Ball Between Knees - 1 x daily - 7 x weekly - 3 sets - 10 reps Clamshell - 1 x daily - 7 x weekly - 3 sets - 10 reps Lateral Step Up with Counter Support - 1 x daily - 7 x weekly - 1 sets - 10 reps

## 2020-10-07 ENCOUNTER — Encounter: Payer: Self-pay | Admitting: Physical Therapy

## 2020-10-07 ENCOUNTER — Ambulatory Visit (INDEPENDENT_AMBULATORY_CARE_PROVIDER_SITE_OTHER): Payer: Medicare Other | Admitting: Physical Therapy

## 2020-10-07 ENCOUNTER — Other Ambulatory Visit: Payer: Self-pay

## 2020-10-07 DIAGNOSIS — M6281 Muscle weakness (generalized): Secondary | ICD-10-CM | POA: Diagnosis not present

## 2020-10-07 DIAGNOSIS — M25561 Pain in right knee: Secondary | ICD-10-CM

## 2020-10-07 DIAGNOSIS — G8929 Other chronic pain: Secondary | ICD-10-CM | POA: Diagnosis not present

## 2020-10-07 NOTE — Therapy (Signed)
[place], [place], [address] Phone: [phone]   Fax:  [phone]  Physical Therapy Treatment  Patient Details  Name: Amber Reynolds MRN: [id number] Date of Birth: [birth year] Referring Provider (PT): thekkekandam   Encounter Date: 10/07/2020   PT End of Session - 10/07/20 1007    Visit Number 4    Number of Visits 12    Date for PT Re-Evaluation 10/28/20    PT Start Time 0925    PT Stop Time 1008    PT Time Calculation (min) 43 min    Activity Tolerance Patient tolerated treatment well    Behavior During Therapy [facility] for tasks assessed/performed           Past Medical History:  Diagnosis Date  . History of colon polyps 09/20/2018    History reviewed. No pertinent surgical history.  There were no vitals filed for this visit.   Subjective Assessment - 10/07/20 0928    Subjective Pt states she feels "a little stiff" in her Rt hip    Patient Stated Goals be able to walk and exercise without pain    Currently in Pain? No/denies                             [facility] Adult PT Treatment/Exercise - 10/07/20 0001      Knee/Hip Exercises: Stretches   ITB Stretch Right;2 reps;30 seconds    Piriformis Stretch Right;2 reps;30 seconds      Knee/Hip Exercises: Aerobic   Nustep L5 x 5 mins for warm up      Knee/Hip Exercises: Machines for Strengthening   Cybex Leg Press 4 plates 2 x 10      Knee/Hip Exercises: Standing   Lateral Step Up Right;1 set;10 reps;Step Height: 4";Hand Hold: 1    Forward Step Up Right;1 set;10 reps;Step Height: 6"    Step Down Right;1 set;15 reps;Hand Hold: 1;Step Height: 4"    Step Down Limitations then x 10 with 2 hands on 6'' step    Walking with Sports Cord sidestep with red TB x 2 laps      Knee/Hip Exercises: Supine   Straight Leg Raises Right;Strengthening;10 reps;1 set    Straight Leg Raises Limitations with quad set    Straight Leg Raise  with External Rotation Limitations 2 x 10 cues for form      Knee/Hip Exercises: Sidelying   Hip ABduction Strengthening;Right;2 sets;10 reps    Hip ABduction Limitations assist to reduce compensation    Clams 2 x 10 Rt LE      Manual Therapy   Joint Mobilization Lt hip distraction, grade 2-3 jt mobs laterally    Passive ROM hamstring, hip ER/IR                       PT Long Term Goals - 09/16/20 1006      PT LONG TERM GOAL #1   Title Pt will be independent with HEP for strength and flexibility    Time 6    Period Weeks    Status New    Target Date 10/28/20      PT LONG TERM GOAL #2   Title Pt will improve FOTO to <= 27% limited to demo improved functional abilities    Time 6    Period Weeks    Status New    Target Date 10/28/20  PT LONG TERM GOAL #3   Title Pt will walk x 30 minutes with no increase in symptoms    Time 6    Period Weeks    Status New    Target Date 10/28/20      PT LONG TERM GOAL #4   Title Pt will return to performing exercise videos with no reported increase in symptoms    Time 6    Period Weeks    Status New    Target Date 10/28/20                 Plan - 10/07/20 1008    Clinical Impression Statement Pt is improving strength and coordination in Rt knee, continues with significant tightness in Rt hip requiring cues for alignment with sidelying exercises    PT Treatment/Interventions Cryotherapy;Electrical Stimulation;Patient/family education;Moist Heat;Balance training;Therapeutic exercise;Therapeutic activities;Functional mobility training;Stair training;Passive range of motion;Dry needling;Manual techniques;Vasopneumatic Device    PT Next Visit Plan continue Rt hip ROM and Rt knee strengthening. note for MD    PT [place] and Agree with Plan of Care Patient           Patient will benefit from skilled therapeutic intervention in order to improve the following deficits and impairments:      Visit Diagnosis: Chronic pain of right knee  Muscle weakness (generalized)     Problem List Patient Active Problem List   Diagnosis Date Noted  . Primary osteoarthritis of right knee 09/02/2020  . Elevated LDL cholesterol level 11/13/2019  . History of colon polyps 09/20/2018  . Elevated blood pressure reading without diagnosis of hypertension 09/20/2018   Harvest Stanco,Fahmida, PT   Arlone Lenhardt,Shantil 10/07/2020, 10:11 AM  [facility] [place], [place], [address] Phone: [phone]   Fax:  [phone]  Name: Amber Reynolds MRN: [id number] Date of Birth: 1946-03-27

## 2020-10-08 ENCOUNTER — Other Ambulatory Visit: Payer: Self-pay

## 2020-10-08 ENCOUNTER — Ambulatory Visit (INDEPENDENT_AMBULATORY_CARE_PROVIDER_SITE_OTHER): Payer: Medicare Other | Admitting: Osteopathic Medicine

## 2020-10-08 DIAGNOSIS — Z0000 Encounter for general adult medical examination without abnormal findings: Secondary | ICD-10-CM

## 2020-10-08 DIAGNOSIS — R030 Elevated blood-pressure reading, without diagnosis of hypertension: Secondary | ICD-10-CM

## 2020-10-08 DIAGNOSIS — Z113 Encounter for screening for infections with a predominantly sexual mode of transmission: Secondary | ICD-10-CM

## 2020-10-08 DIAGNOSIS — E7800 Pure hypercholesterolemia, unspecified: Secondary | ICD-10-CM

## 2020-10-08 DIAGNOSIS — Z13820 Encounter for screening for osteoporosis: Secondary | ICD-10-CM

## 2020-10-08 NOTE — Progress Notes (Signed)
MEDICARE ANNUAL WELLNESS VISIT  10/08/2020  Telephone Visit Disclaimer This Medicare AWV was conducted by telephone due to national recommendations for restrictions regarding the COVID-19 Pandemic (e.g. social distancing).  I verified, using two identifiers, that I am speaking with [name] or their authorized healthcare agent. I discussed the limitations, risks, security, and privacy concerns of performing an evaluation and management service by telephone and the potential availability of an in-person appointment in the future. The patient expressed understanding and agreed to proceed.  Location of Patient: Home Location of Provider (nurse):  In the office.  Subjective:    Amber Reynolds is a 75 y.o. female patient of [name], DO who had a Medicare Annual Wellness Visit today via telephone. [name] is Retired and lives with their spouse. she has 3 children. she reports that she is socially active and does interact with friends/family regularly. she is moderately physically active and enjoys walking, reading and watching tv.  Patient Care Team: [name], DO as PCP - General (Osteopathic Medicine)  Advanced Directives 10/08/2020 09/25/2019  Does Patient Have a Medical Advance Directive? Yes Yes  Type of Estate agent of [place];Living will Healthcare Power of [place];Living will  Does patient want to make changes to medical advance directive? No - Patient declined No - Patient declined  Copy of Healthcare Power of Attorney in Chart? No - copy requested No - copy requested  Would patient like information on creating a medical advance directive? No - Patient declined [facility] Utilization Over the Past 12 Months: # of hospitalizations or ER visits: 0 # of surgeries: 0  Review of Systems    Patient reports that her overall health is unchanged compared to last year.  History obtained from chart review and the patient  Patient  Reported Readings (BP, Pulse, CBG, Weight, etc) Weight 145lbs Heigh 24ft  Pain Assessment Pain : No/denies pain     Current Medications & Allergies (verified) Allergies as of 10/08/2020   No Known Allergies     Medication List       Accurate as of October 08, 2020  8:34 AM. If you have any questions, ask your nurse or doctor.        meloxicam 15 MG tablet Commonly known as: MOBIC One tab PO qAM with a meal for 2 weeks, then daily prn pain.   multivitamin tablet Take 1 tablet by mouth daily.   PROBIOTIC-10 ULTIMATE PO Take by mouth.   Vitamin D3 50 MCG (2000 UT) Tabs Take by mouth.       History (reviewed): Past Medical History:  Diagnosis Date  . History of colon polyps 09/20/2018   History reviewed. No pertinent surgical history. Family History  Problem Relation Age of Onset  . Heart attack Mother   . Diabetes Son    Social History   Socioeconomic History  . Marital status: Married    Spouse name: [name]  . Number of children: 3  . Years of education: 83  . Highest education level: Some college, no degree  Occupational History  . Occupation: Education officer, community- [employer]    Comment: retired  Tobacco Use  . Smoking status: Never Smoker  . Smokeless tobacco: Never Used  Vaping Use  . Vaping Use: Never used  Substance and Sexual Activity  . Alcohol use: Not Currently  . Drug use: Never  . Sexual activity: Not Currently  Other Topics Concern  . Not on file  Social History  Narrative   Works out 3-4 times a week. Reads a lot. Watches TV.    Social Determinants of Health   Financial Resource Strain: Low Risk   . Difficulty of Paying Living Expenses: Not hard at all  Food Insecurity: No Food Insecurity  . Worried About Programme researcher, broadcasting/film/video in the Last Year: Never true  . Ran Out of Food in the Last Year: Never true  Transportation Needs: No Transportation Needs  . Lack of Transportation (Medical): No  . Lack of Transportation (Non-Medical): No   Physical Activity: Sufficiently Active  . Days of Exercise per Week: 4 days  . Minutes of Exercise per Session: 60 min  Stress: No Stress Concern Present  . Feeling of Stress : Not at all  Social Connections: Moderately Integrated  . Frequency of Communication with Friends and Family: More than three times a week  . Frequency of Social Gatherings with Friends and Family: Once a week  . Attends Religious Services: More than 4 times per year  . Active Member of Clubs or Organizations: No  . Attends Banker Meetings: Never  . Marital Status: Married    Activities of Daily Living In your present state of health, do you have any difficulty performing the following activities: 10/08/2020  Hearing? N  Vision? N  Difficulty concentrating or making decisions? N  Walking or climbing stairs? Y  Comment currently under treatment with PT due to right leg  Dressing or bathing? N  Doing errands, shopping? N  Preparing Food and eating ? N  Using the Toilet? N  In the past six months, have you accidently leaked urine? N  Do you have problems with loss of bowel control? N  Managing your Medications? N  Managing your Finances? N  Housekeeping or managing your Housekeeping? N  Some recent data might be hidden    Patient Education/ Literacy How often do you need to have someone help you when you read instructions, pamphlets, or other written materials from your doctor or pharmacy?: 1 - Never What is the last grade level you completed in school?: 12  Exercise Current Exercise Habits: Home exercise routine, Type of exercise: strength training/weights;stretching, Time (Minutes): 60, Frequency (Times/Week): 4, Weekly Exercise (Minutes/Week): 240, Exercise limited by: orthopedic condition(s)  Diet Patient reports consuming 2 meals a day and 2 snack(s) a day Patient reports that her primary diet is: Low Sodium Patient reports that she does have regular access to food.   Depression  Screen PHQ 2/9 Scores 10/08/2020 09/25/2019 09/20/2018  PHQ - 2 Score 0 0 0  PHQ- 9 Score 0 - -     Fall Risk Fall Risk  10/08/2020 09/25/2019  Falls in the past year? - 0  Number falls in past yr: 0 0  Injury with Fall? 0 0  Risk for fall due to : No Fall Risks No Fall Risks  Follow up Falls evaluation completed Falls prevention discussed     Objective:  BRYLYN NOVAKOVICH seemed alert and oriented and she participated appropriately during our telephone visit.  Blood Pressure Weight BMI  BP Readings from Last 3 Encounters:  10/04/18 (!) 156/66  09/20/18 (!) 163/85   Wt Readings from Last 3 Encounters:  10/04/18 150 lb (68 kg)  09/20/18 147 lb 12.8 oz (67 kg)   BMI Readings from Last 1 Encounters:  10/04/18 29.29 kg/m    *Unable to obtain current vital signs, weight, and BMI due to telephone visit type  Hearing/Vision  .  Davona did not seem to have difficulty with hearing/understanding during the telephone conversation . Reports that she has not had a formal eye exam by an eye care professional within the past year; appt scheduled for Feb, 2022 . Reports that she has not had a formal hearing evaluation within the past year *Unable to fully assess hearing and vision during telephone visit type  Cognitive Function: 6CIT Screen 10/08/2020 09/25/2019  What Year? 0 points 0 points  What month? 0 points 0 points  What time? 0 points 0 points  Count back from 20 0 points 0 points  Months in reverse 0 points 4 points  Repeat phrase 10 points 6 points  Total Score 10 10   (Normal:0-7, Significant for Dysfunction: >8)  Normal Cognitive Function Screening: Yes   Immunization & Health Maintenance Record Immunization History  Administered Date(s) Administered  . Fluad Quad(high Dose 65+) 06/06/2020  . Influenza-Unspecified 08/27/2019  . PFIZER(Purple Top)SARS-COV-2 Vaccination 10/15/2019, 11/13/2019, 07/16/2020  . Pneumococcal Polysaccharide-23 09/25/2019    Health Maintenance   Topic Date Due  . TETANUS/TDAP  Never done  . DEXA SCAN  Never done  . PNA vac Low Risk Adult (2 of 2 - PCV13) 09/24/2020  . COLONOSCOPY (Pts 45-79yrs Insurance coverage will need to be confirmed)  11/22/2020 (Originally 09/08/1991)  . INFLUENZA VACCINE  Completed  . COVID-19 Vaccine  Completed  . Hepatitis C Screening  Completed       Assessment  This is a routine wellness examination for Amber Reynolds.  Health Maintenance: Due or Overdue Health Maintenance Due  Topic Date Due  . TETANUS/TDAP  Never done  . DEXA SCAN  Never done  . PNA vac Low Risk Adult (2 of 2 - PCV13) 09/24/2020    Jeanella Flattery does not need a referral for Community Assistance: Care Management:   no Social Work:    no Prescription Assistance:  no Nutrition/Diabetes Education:  no   Plan:  Personalized Goals Goals Addressed              This Visit's Progress   .  Patient Stated (pt-stated)        10/08/2020 AWV Goal: Exercise for General Health   Patient will verbalize understanding of the benefits of increased physical activity:  Exercising regularly is important. It will improve your overall fitness, flexibility, and endurance.  Regular exercise also will improve your overall health. It can help you control your weight, reduce stress, and improve your bone density.  Over the next year, patient will increase physical activity as tolerated with a goal of at least 150 minutes of moderate physical activity per week.   You can tell that you are exercising at a moderate intensity if your heart starts beating faster and you start breathing faster but can still hold a conversation.  Moderate-intensity exercise ideas include:  Walking 1 mile (1.6 km) in about 15 minutes  Biking  Hiking  Golfing  Dancing  Water aerobics  Patient will verbalize understanding of everyday activities that increase physical activity by providing examples like the following: ? Yard work, such  as: ? Pushing a Conservation officer, nature ? Raking and bagging leaves ? Washing your car ? Pushing a stroller ? Shoveling snow ? Gardening ? Washing windows or floors  Patient will be able to explain general safety guidelines for exercising:   Before you start a new exercise program, talk with your health care provider.  Do not exercise so much that you hurt yourself, feel dizzy,  or get very short of breath.  Wear comfortable clothes and wear shoes with good support.  Drink plenty of water while you exercise to prevent dehydration or heat stroke.  Work out until your breathing and your heartbeat get faster.         Personalized Health Maintenance & Screening Recommendations  Pneumococcal vaccine  Td vaccine Bone densitometry screening Colorectal cancer screening - due in March, 2022  Lung Cancer Screening Recommended: no (Low Dose CT Chest recommended if Age 24-80 years, 30 pack-year currently smoking OR have quit w/in past 15 years) Hepatitis C Screening recommended: no HIV Screening recommended: yes  Advanced Directives: Written information was not prepared per patient's request.  Referrals & Orders Dexa scan referral  Follow-up Plan . Follow-up with Emeterio Reeve, DO as planned . Schedule your vaccine appointment for Td and shingrix at your pharmacy.  . Lab orders along with HIV screening order will be sent to the lab.  Merton Border (pneumonia) can be given at the office.   I have personally reviewed and noted the following in the patient's chart:   . Medical and social history . Use of alcohol, tobacco or illicit drugs  . Current medications and supplements . Functional ability and status . Nutritional status . Physical activity . Advanced directives . List of other physicians . Hospitalizations, surgeries, and ER visits in previous 12 months . Vitals . Screenings to include cognitive, depression, and falls . Referrals and appointments  In addition, I have reviewed  and discussed with Jeanella Flattery certain preventive protocols, quality metrics, and best practice recommendations. A written personalized care plan for preventive services as well as general preventive health recommendations is available and can be mailed to the patient at her request.      Tinnie Gens, RN  10/08/2020

## 2020-10-08 NOTE — Progress Notes (Signed)
Annual labs and DG Bone Density screening ordered. Per pt's request.

## 2020-10-08 NOTE — Patient Instructions (Addendum)
Bone Density Test A bone density test uses a type of X-ray to measure the amount of calcium and other minerals in a person's bones. It can measure bone density in the hip and the spine. The test is similar to having a regular X-ray. This test may also be called:  Bone densitometry.  Bone mineral density test.  Dual-energy X-ray absorptiometry (DEXA). You may have this test to:  Diagnose a condition that causes weak or thin bones (osteoporosis).  Screen you for osteoporosis.  Predict your risk for a broken bone (fracture).  Determine how well your osteoporosis treatment is working. Tell a health care provider about:  Any allergies you have.  All medicines you are taking, including vitamins, herbs, eye drops, creams, and over-the-counter medicines.  Any problems you or family members have had with anesthetic medicines.  Any blood disorders you have.  Any surgeries you have had.  Any medical conditions you have.  Whether you are pregnant or may be pregnant.  Any medical tests you have had within the past 14 days that used contrast material. What are the risks? Generally, this is a safe test. However, it does expose you to a small amount of radiation, which can slightly increase your cancer risk. What happens before the test?  Do not take any calcium supplements within the 24 hours before your test.  You will need to remove all metal jewelry, eyeglasses, removable dental appliances, and any other metal objects on your body. What happens during the test?  You will lie down on an exam table. There will be an X-ray generator below you and an imaging device above you.  Other devices, such as boxes or braces, may be used to position your body properly for the scan.  The machine will slowly scan your body. You will need to keep very still while the machine does the scan.  The images will show up on a screen in the room. Images will be examined by a specialist after your  test is finished. The procedure may vary among health care providers and hospitals.   What can I expect after the test? It is up to you to get the results of your test. Ask your health care provider, or the department that is doing the test, when your results will be ready. Summary  A bone density test is an imaging test that uses a type of X-ray to measure the amount of calcium and other minerals in your bones.  The test may be used to diagnose or screen you for a condition that causes weak or thin bones (osteoporosis), predict your risk for a broken bone (fracture), or determine how well your osteoporosis treatment is working.  Do not take any calcium supplements within 24 hours before your test.  Ask your health care provider, or the department that is doing the test, when your results will be ready. This information is not intended to replace advice given to you by your health care provider. Make sure you discuss any questions you have with your health care provider. Document Revised: 02/14/2020 Document Reviewed: 02/14/2020 Elsevier Patient Education  [place] Maintenance, Female Adopting a healthy lifestyle and getting preventive care are important in promoting health and wellness. Ask your health care provider about:  The right schedule for you to have regular tests and exams.  Things you can do on your own to prevent diseases and keep yourself healthy. What should I know about diet, weight, and exercise?  Eat a healthy diet  Eat a diet that includes plenty of vegetables, fruits, low-fat dairy products, and lean protein.  Do not eat a lot of foods that are high in solid fats, added sugars, or sodium.   Maintain a healthy weight Body mass index (BMI) is used to identify weight problems. It estimates body fat based on height and weight. Your health care provider can help determine your BMI and help you achieve or maintain a healthy weight. Get regular  exercise Get regular exercise. This is one of the most important things you can do for your health. Most adults should:  Exercise for at least 150 minutes each week. The exercise should increase your heart rate and make you sweat (moderate-intensity exercise).  Do strengthening exercises at least twice a week. This is in addition to the moderate-intensity exercise.  Spend less time sitting. Even light physical activity can be beneficial. Watch cholesterol and blood lipids Have your blood tested for lipids and cholesterol at 76 years of age, then have this test every 5 years. Have your cholesterol levels checked more often if:  Your lipid or cholesterol levels are high.  You are older than 76 years of age.  You are at high risk for heart disease. What should I know about cancer screening? Depending on your health history and family history, you may need to have cancer screening at various ages. This may include screening for:  Breast cancer.  Cervical cancer.  Colorectal cancer.  Skin cancer.  Lung cancer. What should I know about heart disease, diabetes, and high blood pressure? Blood pressure and heart disease  High blood pressure causes heart disease and increases the risk of stroke. This is more likely to develop in people who have high blood pressure readings, are of African descent, or are overweight.  Have your blood pressure checked: ? Every 3-5 years if you are 2-3 years of age. ? Every year if you are 12 years old or older. Diabetes Have regular diabetes screenings. This checks your fasting blood sugar level. Have the screening done:  Once every three years after age 19 if you are at a normal weight and have a low risk for diabetes.  More often and at a younger age if you are overweight or have a high risk for diabetes. What should I know about preventing infection? Hepatitis B If you have a higher risk for hepatitis B, you should be screened for this virus.  Talk with your health care provider to find out if you are at risk for hepatitis B infection. Hepatitis C Testing is recommended for:  Everyone born from [birth year] through 1966.  Anyone with known risk factors for hepatitis C. Sexually transmitted infections (STIs)  Get screened for STIs, including gonorrhea and chlamydia, if: ? You are sexually active and are younger than 76 years of age. ? You are older than 76 years of age and your health care provider tells you that you are at risk for this type of infection. ? Your sexual activity has changed since you were last screened, and you are at increased risk for chlamydia or gonorrhea. Ask your health care provider if you are at risk.  Ask your health care provider about whether you are at high risk for HIV. Your health care provider may recommend a prescription medicine to help prevent HIV infection. If you choose to take medicine to prevent HIV, you should first get tested for HIV. You should then be tested every 3 months for  as long as you are taking the medicine. Pregnancy  If you are about to stop having your period (premenopausal) and you may become pregnant, seek counseling before you get pregnant.  Take 400 to 800 micrograms (mcg) of folic acid every day if you become pregnant.  Ask for birth control (contraception) if you want to prevent pregnancy. Osteoporosis and menopause Osteoporosis is a disease in which the bones lose minerals and strength with aging. This can result in bone fractures. If you are 76 years old or older, or if you are at risk for osteoporosis and fractures, ask your health care provider if you should:  Be screened for bone loss.  Take a calcium or vitamin D supplement to lower your risk of fractures.  Be given hormone replacement therapy (HRT) to treat symptoms of menopause. Follow these instructions at home: Lifestyle  Do not use any products that contain nicotine or tobacco, such as cigarettes, e-cigarettes,  and chewing tobacco. If you need help quitting, ask your health care provider.  Do not use street drugs.  Do not share needles.  Ask your health care provider for help if you need support or information about quitting drugs. Alcohol use  Do not drink alcohol if: ? Your health care provider tells you not to drink. ? You are pregnant, may be pregnant, or are planning to become pregnant.  If you drink alcohol: ? Limit how much you use to 0-1 drink a day. ? Limit intake if you are breastfeeding.  Be aware of how much alcohol is in your drink. In the [place] one drink equals one 12 oz bottle of beer (355 mL), one 5 oz glass of wine (148 mL), or one 1 oz glass of hard liquor (44 mL). General instructions  Schedule regular health, dental, and eye exams.  Stay current with your vaccines.  Tell your health care provider if: ? You often feel depressed. ? You have ever been abused or do not feel safe at home. Summary  Adopting a healthy lifestyle and getting preventive care are important in promoting health and wellness.  Follow your health care provider's instructions about healthy diet, exercising, and getting tested or screened for diseases.  Follow your health care provider's instructions on monitoring your cholesterol and blood pressure. This information is not intended to replace advice given to you by your health care provider. Make sure you discuss any questions you have with your health care provider. Document Revised: 08/23/2018 Document Reviewed: 08/23/2018 Elsevier Patient Education  2021 [place] Maintenance Summary and Written Plan of Care  [name] ,  Thank you for allowing me to perform your Medicare Annual Wellness Visit and for your ongoing commitment to your health.   Health Maintenance & Immunization History Health Maintenance  Topic Date Due  . TETANUS/TDAP  Never done  . DEXA SCAN  Never done  . PNA vac  Low Risk Adult (2 of 2 - PCV13) 09/24/2020  . COLONOSCOPY (Pts 45-29yrs Insurance coverage will need to be confirmed)  11/22/2020 (Originally 09/08/1990)  . INFLUENZA VACCINE  Completed  . COVID-19 Vaccine  Completed  . Hepatitis C Screening  Completed   Immunization History  Administered Date(s) Administered  . Fluad Quad(high Dose 65+) 06/06/2020  . Influenza-Unspecified 08/27/2019  . PFIZER(Purple Top)SARS-COV-2 Vaccination 10/15/2019, 11/13/2019, 07/16/2020  . Pneumococcal Polysaccharide-23 09/25/2019    These are the patient goals that we discussed: Goals Addressed  This Visit's Progress   .  Patient Stated (pt-stated)        10/08/2020 AWV Goal: Exercise for General Health   Patient will verbalize understanding of the benefits of increased physical activity:  Exercising regularly is important. It will improve your overall fitness, flexibility, and endurance.  Regular exercise also will improve your overall health. It can help you control your weight, reduce stress, and improve your bone density.  Over the next year, patient will increase physical activity as tolerated with a goal of at least 150 minutes of moderate physical activity per week.   You can tell that you are exercising at a moderate intensity if your heart starts beating faster and you start breathing faster but can still hold a conversation.  Moderate-intensity exercise ideas include:  Walking 1 mile (1.6 km) in about 15 minutes  Biking  Hiking  Golfing  Dancing  Water aerobics  Patient will verbalize understanding of everyday activities that increase physical activity by providing examples like the following: ? Yard work, such as: ? Pushing a Conservation officer, nature ? Raking and bagging leaves ? Washing your car ? Pushing a stroller ? Shoveling snow ? Gardening ? Washing windows or floors  Patient will be able to explain general safety guidelines for exercising:   Before you start a new  exercise program, talk with your health care provider.  Do not exercise so much that you hurt yourself, feel dizzy, or get very short of breath.  Wear comfortable clothes and wear shoes with good support.  Drink plenty of water while you exercise to prevent dehydration or heat stroke.  Work out until your breathing and your heartbeat get faster.           This is a list of Health Maintenance Items that are overdue or due now: Health Maintenance Due  Topic Date Due  . TETANUS/TDAP  Never done  . DEXA SCAN  Never done  . PNA vac Low Risk Adult (2 of 2 - PCV13) 09/24/2020     Orders/Referrals Placed Today: Dexa scan referral   Follow-up Plan . Follow-up with Emeterio Reeve, DO as planned . Schedule your vaccine appointment for Td and shingrix at your pharmacy.  . Lab orders along with HIV screening order will be sent to the lab.  [place] Border (pneumonia) can be given at the office.

## 2020-10-10 ENCOUNTER — Other Ambulatory Visit: Payer: Self-pay

## 2020-10-10 ENCOUNTER — Ambulatory Visit (INDEPENDENT_AMBULATORY_CARE_PROVIDER_SITE_OTHER): Payer: Medicare Other | Admitting: Physical Therapy

## 2020-10-10 ENCOUNTER — Encounter: Payer: Self-pay | Admitting: Physical Therapy

## 2020-10-10 DIAGNOSIS — G8929 Other chronic pain: Secondary | ICD-10-CM

## 2020-10-10 DIAGNOSIS — M6281 Muscle weakness (generalized): Secondary | ICD-10-CM | POA: Diagnosis not present

## 2020-10-10 DIAGNOSIS — M25561 Pain in right knee: Secondary | ICD-10-CM

## 2020-10-10 NOTE — Therapy (Signed)
[place], [place], [address] Phone: [phone]   Fax:  [phone]  Physical Therapy Treatment  Patient Details  Name: Amber Reynolds MRN: [id number] Date of Birth: [birth year] Referring Provider (PT): thekkekandam   Encounter Date: 10/10/2020   PT End of Session - 10/10/20 1014    Visit Number 5    Number of Visits 12    Date for PT Re-Evaluation 10/28/20    PT Start Time 0925    PT Stop Time 1013    PT Time Calculation (min) 48 min    Activity Tolerance Patient tolerated treatment well    Behavior During Therapy [facility] for tasks assessed/performed           Past Medical History:  Diagnosis Date  . History of colon polyps 09/20/2018    History reviewed. No pertinent surgical history.  There were no vitals filed for this visit.   Subjective Assessment - 10/10/20 1018    Subjective Pt states she feels stiff in her Rt inner thigh    Patient Stated Goals be able to walk and exercise without pain    Currently in Pain? Yes    Pain Score 2     Pain Location Hip    Pain Orientation Right;Medial    Pain Descriptors / Indicators Tightness                             [facility] Adult PT Treatment/Exercise - 10/10/20 0001      Knee/Hip Exercises: Stretches   ITB Stretch Right;2 reps;30 seconds    Piriformis Stretch Right;60 seconds      Knee/Hip Exercises: Aerobic   Nustep L5 x 5 mins for warm up      Knee/Hip Exercises: Machines for Strengthening   Cybex Leg Press 4 plates 2 x 10      Knee/Hip Exercises: Standing   Lateral Step Up Right;1 set;10 reps;Hand Hold: 1;Step Height: 6"    Forward Step Up Right;2 sets;10 reps;Hand Hold: 1;Step Height: 6"    Step Down Right;1 set;10 reps;Hand Hold: 1;Step Height: 6"    Wall Squat 3 sets   15 seconds   Walking with Sports Cord sidestep with red TB around knees    Other Standing Knee Exercises dead lift with 10# KB 2 x 10       Knee/Hip Exercises: Sidelying   Hip ABduction Strengthening;Right;2 sets;10 reps    Clams 2 x 10 Rt LE with red TB      Manual Therapy   Joint Mobilization Lt hip distraction, grade 3-4 lateral jt mobs Rt hip    Passive ROM hip IR/ER, adduction                  PT Education - 10/10/20 1014    Education Details hip adduction stretches supine, seated and standing    Person(s) Educated Patient    Methods Explanation;Demonstration    Comprehension Verbalized understanding;Returned demonstration               PT Long Term Goals - 09/16/20 1006      PT LONG TERM GOAL #1   Title Pt will be independent with HEP for strength and flexibility    Time 6    Period Weeks    Status New    Target Date 10/28/20      PT LONG TERM GOAL #2   Title Pt will improve FOTO to <=  27% limited to demo improved functional abilities    Time 6    Period Weeks    Status New    Target Date 10/28/20      PT LONG TERM GOAL #3   Title Pt will walk x 30 minutes with no increase in symptoms    Time 6    Period Weeks    Status New    Target Date 10/28/20      PT LONG TERM GOAL #4   Title Pt will return to performing exercise videos with no reported increase in symptoms    Time 6    Period Weeks    Status New    Target Date 10/28/20                 Plan - 10/10/20 1015    Clinical Impression Statement Pt continues with signficant decrease in flexibility and hypomobility of Rt hip joint. Improving Rt knee strength and was able to add wall squats and dead lifts with no increase in symptoms    PT Frequency 2x / week    PT Duration 6 weeks    PT Treatment/Interventions Cryotherapy;Electrical Stimulation;Patient/family education;Moist Heat;Balance training;Therapeutic exercise;Therapeutic activities;Functional mobility training;Stair training;Passive range of motion;Dry needling;Manual techniques;Vasopneumatic Device    PT Next Visit Plan continue Rt hip ROM, jt mobs and Rt knee  strengthening. update HEP, note for MD    PT [place] and Agree with Plan of Care Patient           Patient will benefit from skilled therapeutic intervention in order to improve the following deficits and impairments:     Visit Diagnosis: Chronic pain of right knee  Muscle weakness (generalized)     Problem List Patient Active Problem List   Diagnosis Date Noted  . Primary osteoarthritis of right knee 09/02/2020  . Elevated LDL cholesterol level 11/13/2019  . History of colon polyps 09/20/2018  . Elevated blood pressure reading without diagnosis of hypertension 09/20/2018   Amber Reynolds,Amber Reynolds, PT  Amber Reynolds,Amber Reynolds 10/10/2020, 10:19 AM  [facility] [place], [place], [address] Phone: [phone]   Fax:  [phone]  Name: Amber Reynolds MRN: [id number] Date of Birth: 09-07-1946

## 2020-10-13 ENCOUNTER — Other Ambulatory Visit: Payer: Self-pay

## 2020-10-13 ENCOUNTER — Ambulatory Visit (INDEPENDENT_AMBULATORY_CARE_PROVIDER_SITE_OTHER): Payer: Medicare Other | Admitting: Physical Therapy

## 2020-10-13 ENCOUNTER — Encounter: Payer: Self-pay | Admitting: Physical Therapy

## 2020-10-13 DIAGNOSIS — M6281 Muscle weakness (generalized): Secondary | ICD-10-CM

## 2020-10-13 DIAGNOSIS — G8929 Other chronic pain: Secondary | ICD-10-CM | POA: Diagnosis not present

## 2020-10-13 DIAGNOSIS — M25561 Pain in right knee: Secondary | ICD-10-CM

## 2020-10-13 NOTE — Therapy (Addendum)
[place], [place], [address] Phone: [phone]   Fax:  [phone]  Physical Therapy Treatment  Patient Details  Name: Amber Reynolds MRN: [id number] Date of Birth: 1946/03/17 Referring Provider (PT): Thekkekandam   Encounter Date: 10/13/2020   PT End of Session - 10/13/20 0930    Visit Number 6    Number of Visits 12    Date for PT Re-Evaluation 10/28/20    PT Start Time 0931    PT Stop Time 1019   MHP last 10 moin   PT Time Calculation (min) 48 min    Activity Tolerance Patient tolerated treatment well    Behavior During Therapy [facility] for tasks assessed/performed           Past Medical History:  Diagnosis Date  . History of colon polyps 09/20/2018    History reviewed. No pertinent surgical history.  There were no vitals filed for this visit.   Subjective Assessment - 10/13/20 0934    Subjective Pt reports 50% improvement since starting therapy. Rt knee no longer bothering her.  Biggest complaint is soreness/stiffness in Rt inner thigh.    Patient Stated Goals be able to walk and exercise without pain    Currently in Pain? Yes    Pain Score 4     Pain Location Leg    Pain Orientation Right;Medial    Aggravating Factors  prolonged standing / walking    Pain Relieving Factors workouts/ stretching              [facility] PT Assessment - 10/13/20 0001      Assessment   Medical Diagnosis primary OA of Rt knee    Referring Provider (PT) Thekkekandam      Strength   Strength Assessment Site Hip;Knee    Right/Left Hip Right    Right Hip Flexion 4+/5    Right Hip ABduction 4+/5   in available range; tight adductors   Right Knee Flexion 4+/5    Right Knee Extension 5/5            [facility] Adult PT Treatment/Exercise - 10/13/20 0001      Knee/Hip Exercises: Stretches   Hip Flexor Stretch Right;2 reps;30 seconds   seated   Piriformis Stretch Right;1 rep;30 seconds   supine; limited  range and tolerance   Other Knee/Hip Stretches standing Rt adductor stretch, leaning on counter x 30 sec x 2;  supine butterfly stretch (very limited range bilat)- repeated after DN with improved range.    Other Knee/Hip Stretches supine Rt fig 4 stretch with downward pressure (very limited range).  Rt supine adductor stretch post DN x 30 sec      Knee/Hip Exercises: Aerobic   Nustep L5: arms/legs x 5 min      Knee/Hip Exercises: Sidelying   Hip ABduction Strengthening;Right;1 set;10 reps    Clams RLE x 15 reps with cues to not roll back/ compensate with back.      Modalities   Modalities Moist Heat      Moist Heat Therapy   Number Minutes Moist Heat 10 Minutes    Moist Heat Location --   Rt thigh     Manual Therapy   Manual Therapy Soft tissue mobilization    Manual therapy comments skilled palpation and monitoring of soft tissue during DN    Soft tissue mobilization STM and TPR to Rt adductors    Passive ROM Rt hip abdct with long leg traction.  Trigger Point Dry Needling - 10/13/20 0001    Consent Given? Yes    Education Handout Provided Yes    Muscles Treated Lower Quadrant Adductor longus/brevis/magnus    Adductor Response Twitch response elicited;Palpable increased muscle length   Rt performed by Jeral Pinch PT               PT Education - 10/13/20 1207    Education Details DN info; added hip flexor and adductor stretch    Person(s) Educated Patient    Methods Explanation;Demonstration;Handout;Tactile cues;Verbal cues    Comprehension Returned demonstration;Verbalized understanding               PT Long Term Goals - 10/13/20 1001      PT LONG TERM GOAL #1   Title Pt will be independent with HEP for strength and flexibility    Time 6    Period Weeks    Status Partially Met      PT LONG TERM GOAL #2   Title Pt will improve FOTO to <= 27% limited to demo improved functional abilities    Baseline FS 63% on intake;  FS 64% on 6th visit.     Time 6    Period Weeks    Status On-going      PT LONG TERM GOAL #3   Title Pt will walk x 30 minutes with no increase in symptoms    Time 6    Period Weeks    Status On-going      PT LONG TERM GOAL #4   Title Pt will return to performing exercise videos with no reported increase in symptoms    Time 6    Period Weeks    Status On-going                 Plan - 10/13/20 0959    Clinical Impression Statement Pt's hip/knee strength of RLE has improved.  Pt presents with decreased flexibility and ROM in Rt hip ext, ER and adduction.  Trial of DN performed by supervising PT, Jeral Pinch with good twitch response. Improved gait observed afterwards.  Pt is reporting 50% overall improvement and has made gradual progress towards goals.    Rehab Potential Good    PT Frequency 2x / week    PT Duration 6 weeks    PT Treatment/Interventions Cryotherapy;Electrical Stimulation;Patient/family education;Moist Heat;Balance training;Therapeutic exercise;Therapeutic activities;Functional mobility training;Stair training;Passive range of motion;Dry needling;Manual techniques;Vasopneumatic Device    PT Next Visit Plan assess response to DN; repeat to adductors/ hip rotators.  continue Rt hip ROM, jt mobs and Rt knee strengthening.    PT Home Exercise Plan [facility]    Consulted and Agree with Plan of Care Patient           Patient will benefit from skilled therapeutic intervention in order to improve the following deficits and impairments:  Decreased range of motion,Decreased activity tolerance,Decreased strength,Impaired flexibility,Pain  Visit Diagnosis: Chronic pain of right knee  Muscle weakness (generalized)     Problem List Patient Active Problem List   Diagnosis Date Noted  . Primary osteoarthritis of right knee 09/02/2020  . Elevated LDL cholesterol level 11/13/2019  . History of colon polyps 09/20/2018  . Elevated blood pressure reading without diagnosis of hypertension  09/20/2018   Kerin Perna, PTA 10/13/20 12:07 PM  Jeral Pinch, PT 10/13/20 12:11 PM   [facility] [place], [place], [address] Phone: [phone]   Fax:  [phone]  Name:  Amber Reynolds MRN: [id number] Date of Birth: June 23, 1946

## 2020-10-13 NOTE — Patient Instructions (Addendum)
Trigger Point Dry Needling  . What is Trigger Point Dry Needling (DN)? o DN is a physical therapy technique used to treat muscle pain and dysfunction. Specifically, DN helps deactivate muscle trigger points (muscle knots).  o A thin filiform needle is used to penetrate the skin and stimulate the underlying trigger point. The goal is for a local twitch response (LTR) to occur and for the trigger point to relax. No medication of any kind is injected during the procedure.   . What Does Trigger Point Dry Needling Feel Like?  o The procedure feels different for each individual patient. Some patients report that they do not actually feel the needle enter the skin and overall the process is not painful. Very mild bleeding may occur. However, many patients feel a deep cramping in the muscle in which the needle was inserted. This is the local twitch response.   Marland Kitchen How Will I feel after the treatment? o Soreness is normal, and the onset of soreness may not occur for a few hours. Typically this soreness does not last longer than two days.  o Bruising is uncommon, however; ice can be used to decrease any possible bruising.  o In rare cases feeling tired or nauseous after the treatment is normal. In addition, your symptoms may get worse before they get better, this period will typically not last longer than 24 hours.   . What Can I do After My Treatment? o Increase your hydration by drinking more water for the next 24 hours. o You may place ice or heat on the areas treated that have become sore, however, do not use heat on inflamed or bruised areas. Heat often brings more relief post needling. o You can continue your regular activities, but vigorous activity is not recommended initially after the treatment for 24 hours. o DN is best combined with other physical therapy such as strengthening, stretching, and other therapies.    Hip adductor stretch -  Stand at counter, lean forward resting arms on counter.   Feet wide.  Bend left knee to feel stretch on Rt inner thigh. Hold 30 seconds, repeat 2 times.

## 2020-10-14 ENCOUNTER — Ambulatory Visit (INDEPENDENT_AMBULATORY_CARE_PROVIDER_SITE_OTHER): Payer: Medicare Other | Admitting: Sports Medicine

## 2020-10-14 ENCOUNTER — Encounter: Payer: Self-pay | Admitting: Osteopathic Medicine

## 2020-10-14 DIAGNOSIS — Z789 Other specified health status: Secondary | ICD-10-CM | POA: Insufficient documentation

## 2020-10-14 DIAGNOSIS — M1711 Unilateral primary osteoarthritis, right knee: Secondary | ICD-10-CM | POA: Diagnosis not present

## 2020-10-14 NOTE — Assessment & Plan Note (Signed)
This is a very pleasant 75 year old female, long history of right knee pain, she has osteoarthritis on her x-rays, we added meloxicam and she has improved considerably, physical therapy has also been tremendously efficacious.   Return to see me as needed

## 2020-10-14 NOTE — Progress Notes (Signed)
    Procedures performed today:    None.  Independent interpretation of notes and tests performed by another provider:   None.  Brief History, Exam, Impression, and Recommendations:    Primary osteoarthritis of right knee This is a very pleasant 75 year old female, long history of right knee pain, she has osteoarthritis on Reynolds x-rays, we added meloxicam and she has improved considerably, physical therapy has also been tremendously efficacious.   Return to see me as needed    ___________________________________________ Amber Reynolds. Dianah Field, M.D., ABFM., CAQSM. Primary Care and [place] Instructor of [place] [facility] of Medicine

## 2020-10-15 ENCOUNTER — Other Ambulatory Visit: Payer: Self-pay

## 2020-10-15 ENCOUNTER — Ambulatory Visit (INDEPENDENT_AMBULATORY_CARE_PROVIDER_SITE_OTHER): Payer: Medicare Other

## 2020-10-15 ENCOUNTER — Encounter: Payer: Self-pay | Admitting: Physical Therapy

## 2020-10-15 ENCOUNTER — Ambulatory Visit (INDEPENDENT_AMBULATORY_CARE_PROVIDER_SITE_OTHER): Payer: Medicare Other | Admitting: Physical Therapy

## 2020-10-15 DIAGNOSIS — G8929 Other chronic pain: Secondary | ICD-10-CM | POA: Diagnosis not present

## 2020-10-15 DIAGNOSIS — M6281 Muscle weakness (generalized): Secondary | ICD-10-CM | POA: Diagnosis not present

## 2020-10-15 DIAGNOSIS — M25561 Pain in right knee: Secondary | ICD-10-CM | POA: Diagnosis not present

## 2020-10-15 DIAGNOSIS — Z13820 Encounter for screening for osteoporosis: Secondary | ICD-10-CM

## 2020-10-15 DIAGNOSIS — M85852 Other specified disorders of bone density and structure, left thigh: Secondary | ICD-10-CM | POA: Diagnosis not present

## 2020-10-15 DIAGNOSIS — Z780 Asymptomatic menopausal state: Secondary | ICD-10-CM

## 2020-10-15 DIAGNOSIS — E2839 Other primary ovarian failure: Secondary | ICD-10-CM | POA: Diagnosis not present

## 2020-10-15 NOTE — Therapy (Signed)
[place], [place], [address] Phone: [phone]   Fax:  [phone]  Physical Therapy Treatment  Patient Details  Name: Amber Reynolds MRN: [id number] Date of Birth: 07-05-1946 Referring Provider (PT): Thekkekandam   Encounter Date: 10/15/2020   PT End of Session - 10/15/20 0930    Visit Number 7    Number of Visits 12    Date for PT Re-Evaluation 10/28/20    PT Start Time 0930    PT Stop Time 1024    PT Time Calculation (min) 54 min    Activity Tolerance Patient tolerated treatment well    Behavior During Therapy [facility] for tasks assessed/performed           Past Medical History:  Diagnosis Date  . History of colon polyps 09/20/2018    History reviewed. No pertinent surgical history.  There were no vitals filed for this visit.   Subjective Assessment - 10/15/20 0931    Subjective Still sore in the inner thigh Rt , thinks the DN helped some    Patient Stated Goals be able to walk and exercise without pain    Currently in Pain? Yes    Pain Score 4     Pain Location --   thigh   Pain Orientation Right;Medial    Pain Descriptors / Indicators Tightness    Pain Type Chronic pain    Pain Onset More than a month ago    Pain Frequency Intermittent                             [facility] Adult PT Treatment/Exercise - 10/15/20 0001      Self-Care   Self-Care Other Self-Care Comments    Other Self-Care Comments  foam rolling for Rt thigh adductors      Knee/Hip Exercises: Stretches   Other Knee/Hip Stretches standing side lunges for  hip adduction stretching, then with one knee on mat, hooklying windshield wiper knees      Knee/Hip Exercises: Aerobic   Nustep L5: arms/legs x 5 min      Knee/Hip Exercises: Standing   SLS pendulum swing of legs for active movment and stretching      Moist Heat Therapy   Number Minutes Moist Heat 12 Minutes    Moist Heat Location --   Rt  thigh     Manual Therapy   Manual Therapy Soft tissue mobilization    Manual therapy comments skilled palpation and monitoring of soft tissue during DN    Soft tissue mobilization STM and TPR to Rt adductors            Trigger Point Dry Needling - 10/15/20 0001    Consent Given? Yes    Education Handout Provided Previously provided    Muscles Treated Lower Quadrant Adductor longus/brevis/magnus;Hamstring   rt   Electrical Stimulation Performed with Dry Needling Yes    Adductor Response Palpable increased muscle length;Twitch response elicited   rt   Hamstring Response Palpable increased muscle length   Rt medial                    PT Long Term Goals - 10/13/20 1001      PT LONG TERM GOAL #1   Title Pt will be independent with HEP for strength and flexibility    Time 6    Period Weeks    Status Partially Met  PT LONG TERM GOAL #2   Title Pt will improve FOTO to <= 27% limited to demo improved functional abilities    Baseline FS 63% on intake;  FS 64% on 6th visit.    Time 6    Period Weeks    Status On-going      PT LONG TERM GOAL #3   Title Pt will walk x 30 minutes with no increase in symptoms    Time 6    Period Weeks    Status On-going      PT LONG TERM GOAL #4   Title Pt will return to performing exercise videos with no reported increase in symptoms    Time 6    Period Weeks    Status On-going                 Plan - 10/15/20 1021    Clinical Impression Statement [name] had less palpable tightness in her Rt hip adductors today after DN with stim.  She was able to perform some foam rolling as well.  ROM with butter fly stretch was not very different.  Continues to make slow improvement, main concern in pain in inner thigh    Rehab Potential Good    PT Frequency 2x / week    PT Duration 6 weeks    PT Treatment/Interventions Cryotherapy;Electrical Stimulation;Patient/family education;Moist Heat;Balance training;Therapeutic  exercise;Therapeutic activities;Functional mobility training;Stair training;Passive range of motion;Dry needling;Manual techniques;Vasopneumatic Device    PT Next Visit Plan assess response to DN; repeat to adductors/ hip rotators.  continue Rt hip ROM, jt mobs and Rt knee strengthening.    PT Home Exercise Plan [facility]    Consulted and Agree with Plan of Care Patient           Patient will benefit from skilled therapeutic intervention in order to improve the following deficits and impairments:  Decreased range of motion,Decreased activity tolerance,Decreased strength,Impaired flexibility,Pain  Visit Diagnosis: Chronic pain of right knee  Muscle weakness (generalized)     Problem List Patient Active Problem List   Diagnosis Date Noted  . Advance directive in chart 10/14/2020  . Primary osteoarthritis of right knee 09/02/2020  . Elevated LDL cholesterol level 11/13/2019  . History of colon polyps 09/20/2018  . Elevated blood pressure reading without diagnosis of hypertension 09/20/2018    Jeral Pinch PT  10/15/2020, 10:27 AM  [facility] [place], [place], [address] Phone: [phone]   Fax:  [phone]  Name: Amber Reynolds MRN: [id number] Date of Birth: August 21, 1946

## 2020-10-21 ENCOUNTER — Other Ambulatory Visit: Payer: Self-pay

## 2020-10-21 ENCOUNTER — Ambulatory Visit (INDEPENDENT_AMBULATORY_CARE_PROVIDER_SITE_OTHER): Payer: Medicare Other | Admitting: Osteopathic Medicine

## 2020-10-21 DIAGNOSIS — E7800 Pure hypercholesterolemia, unspecified: Secondary | ICD-10-CM | POA: Diagnosis not present

## 2020-10-21 DIAGNOSIS — Z23 Encounter for immunization: Secondary | ICD-10-CM | POA: Diagnosis not present

## 2020-10-21 DIAGNOSIS — Z0000 Encounter for general adult medical examination without abnormal findings: Secondary | ICD-10-CM | POA: Diagnosis not present

## 2020-10-21 DIAGNOSIS — R030 Elevated blood-pressure reading, without diagnosis of hypertension: Secondary | ICD-10-CM | POA: Diagnosis not present

## 2020-10-21 NOTE — Progress Notes (Signed)
Prevnar 13 given LD without immediate complications.

## 2020-10-22 LAB — CBC
HCT: 47.2 % — ABNORMAL HIGH (ref 35.0–45.0)
Hemoglobin: 15.8 g/dL — ABNORMAL HIGH (ref 11.7–15.5)
MCH: 30.2 pg (ref 27.0–33.0)
MCHC: 33.5 g/dL (ref 32.0–36.0)
MCV: 90.2 fL (ref 80.0–100.0)
MPV: 10.1 fL (ref 7.5–12.5)
Platelets: 264 10*3/uL (ref 140–400)
RBC: 5.23 10*6/uL — ABNORMAL HIGH (ref 3.80–5.10)
RDW: 12.1 % (ref 11.0–15.0)
WBC: 6 10*3/uL (ref 3.8–10.8)

## 2020-10-22 LAB — LIPID PANEL
Cholesterol: 233 mg/dL — ABNORMAL HIGH (ref <200)
HDL: 52 mg/dL (ref >=50)
LDL Cholesterol (Calc): 160 mg/dL (calc) — ABNORMAL HIGH
Non-HDL Cholesterol (Calc): 181 mg/dL (calc) — ABNORMAL HIGH (ref <130)
Total CHOL/HDL Ratio: 4.5 (calc) (ref <5.0)
Triglycerides: 99 mg/dL (ref <150)

## 2020-10-22 LAB — COMPLETE METABOLIC PANEL WITH GFR
AG Ratio: 2 (calc) (ref 1.0–2.5)
ALT: 25 U/L (ref 6–29)
AST: 22 U/L (ref 10–35)
Albumin: 4.2 g/dL (ref 3.6–5.1)
Alkaline phosphatase (APISO): 72 U/L (ref 37–153)
BUN/Creatinine Ratio: 25 (calc) — ABNORMAL HIGH (ref 6–22)
BUN: 15 mg/dL (ref 7–25)
CO2: 29 mmol/L (ref 20–32)
Calcium: 9.7 mg/dL (ref 8.6–10.4)
Chloride: 110 mmol/L (ref 98–110)
Creat: 0.59 mg/dL — ABNORMAL LOW (ref 0.60–0.93)
GFR, Est African American: 104 mL/min/{1.73_m2} (ref >=60)
GFR, Est Non African American: 90 mL/min/{1.73_m2} (ref >=60)
Globulin: 2.1 g/dL (calc) (ref 1.9–3.7)
Glucose, Bld: 85 mg/dL (ref 65–99)
Potassium: 5.1 mmol/L (ref 3.5–5.3)
Sodium: 146 mmol/L (ref 135–146)
Total Bilirubin: 0.5 mg/dL (ref 0.2–1.2)
Total Protein: 6.3 g/dL (ref 6.1–8.1)

## 2020-10-22 LAB — TSH: TSH: 1.66 mIU/L (ref 0.40–4.50)

## 2020-10-23 ENCOUNTER — Other Ambulatory Visit: Payer: Self-pay

## 2020-10-23 ENCOUNTER — Ambulatory Visit (INDEPENDENT_AMBULATORY_CARE_PROVIDER_SITE_OTHER): Payer: Medicare Other | Admitting: Physical Therapy

## 2020-10-23 ENCOUNTER — Encounter: Payer: Self-pay | Admitting: Physical Therapy

## 2020-10-23 DIAGNOSIS — M25561 Pain in right knee: Secondary | ICD-10-CM

## 2020-10-23 DIAGNOSIS — M6281 Muscle weakness (generalized): Secondary | ICD-10-CM

## 2020-10-23 DIAGNOSIS — G8929 Other chronic pain: Secondary | ICD-10-CM

## 2020-10-23 NOTE — Therapy (Signed)
[place] [address] [place], [place], [address] Phone: [phone]   Fax:  [phone]  Physical Therapy Treatment/Discharge   Patient Details  Name: Amber Reynolds MRN: [id number] Date of Birth: 03-02-1946 Referring Provider (PT): Dianah Field   Encounter Date: 10/23/2020   PT End of Session - 10/23/20 0840    Visit Number 8    Number of Visits 12    Date for PT Re-Evaluation 10/28/20    PT Start Time 0841    PT Stop Time 0930    PT Time Calculation (min) 49 min    Activity Tolerance Patient tolerated treatment well    Behavior During Therapy [facility] for tasks assessed/performed           Past Medical History:  Diagnosis Date  . History of colon polyps 09/20/2018    History reviewed. No pertinent surgical history.  There were no vitals filed for this visit.   Subjective Assessment - 10/23/20 0841    Subjective pt reports today is her last day, reports 75% improvement    How long can you walk comfortably? a couple of blocks, 15-20 min    Patient Stated Goals be able to walk and exercise without pain    Currently in Pain? Yes    Pain Score 4     Pain Location --   inner thigh Rt   Pain Orientation Left    Pain Descriptors / Indicators Tightness    Pain Type Chronic pain    Aggravating Factors  prolonged standing/walking    Pain Relieving Factors heat              [facility] PT Assessment - 10/23/20 0001      Assessment   Medical Diagnosis primary OA of Rt knee    Referring Provider (PT) Thekkekandam      Observation/Other Assessments   Focus on Therapeutic Outcomes (FOTO)  41% limited      ROM / Strength   AROM / PROM / Strength PROM      AROM   AROM Assessment Site --   bilat hip rotation limited   Right/Left Knee --   bilat = and WNL     PROM   Overall PROM Comments hip rotation Rt/Lt IR 32/40,   ER 28/35      Strength   Right Hip Flexion 5/5    Right Hip Extension 5/5    Right Hip  ABduction 5/5    Right Knee Flexion 5/5    Right Knee Extension 5/5    Left Knee Flexion 5/5    Left Knee Extension 5/5                         [facility] Adult PT Treatment/Exercise - 10/23/20 0001      Knee/Hip Exercises: Stretches   Other Knee/Hip Stretches standing side lunges for  hip adduction stretching, then with one knee on mat, hooklying windshield wiper knees    Other Knee/Hip Stretches 3x30 sec hip adduction stretching with strap and leg straight      Knee/Hip Exercises: Seated   Other Seated Knee/Hip Exercises setaed in butterfly stretch 10 sec x3 reps knee press downs.      Modalities   Modalities Moist Heat      Moist Heat Therapy   Number Minutes Moist Heat 10 Minutes    Moist Heat Location --   Rt inner thigh     Manual Therapy   Manual  Therapy Soft tissue mobilization    Manual therapy comments skilled palpation and monitoring of soft tissue during DN    Soft tissue mobilization STM and TPR to Rt adductors            Trigger Point Dry Needling - 10/23/20 0001    Consent Given? Yes    Education Handout Provided Previously provided    Muscles Treated Lower Quadrant Adductor longus/brevis/magnus   rt   Electrical Stimulation Performed with Dry Needling Yes    Adductor Response Palpable increased muscle length                     PT Long Term Goals - 10/23/20 0843      PT LONG TERM GOAL #1   Title Pt will be independent with HEP for strength and flexibility    Status Achieved      PT LONG TERM GOAL #2   Title Pt will improve FOTO to <= 27% limited to demo improved functional abilities    Baseline 41% limited    Status Not Met      PT LONG TERM GOAL #3   Title Pt will walk x 30 minutes with no increase in symptoms    Baseline pt will have increased sypmtoms after 20'    Status Not Met      PT LONG TERM GOAL #4   Title Pt will return to performing exercise videos with no reported increase in symptoms    Status Achieved                  Plan - 10/23/20 0857    Clinical Impression Statement This is Karens last PT visit, she is pleased with her progress and reports at least 75% improvement in her symptoms.  She has partially met her goals.  Her lower body strength has improved to 5/5, she does still have tightness in bilat hip adductors however it is much improved from iniital visit.  Bilateral hip rotation is tight this should improve with HEP performance.  Delani still has pain in the medial Rt thigh/knee with long ambulations.    PT Next Visit Plan D/C to HEP as patient is pleased with her progress and has a good HEP    PT Home Exercise Plan [place] and Agree with Plan of Care Patient           Patient will benefit from skilled therapeutic intervention in order to improve the following deficits and impairments:     Visit Diagnosis: Chronic pain of right knee  Muscle weakness (generalized)     Problem List Patient Active Problem List   Diagnosis Date Noted  . Advance directive in chart 10/14/2020  . Primary osteoarthritis of right knee 09/02/2020  . Elevated LDL cholesterol level 11/13/2019  . History of colon polyps 09/20/2018  . Elevated blood pressure reading without diagnosis of hypertension 09/20/2018    Jeral Pinch PT  10/23/2020, 1:16 PM  [facility] [place], [place], [address] Phone: [phone]   Fax:  [phone]  Name: Amber Reynolds MRN: [id number] Date of Birth: 1946/08/16   PHYSICAL THERAPY DISCHARGE SUMMARY  Visits from Start of Care: 8  Current functional level related to goals / functional outcomes: See above for current strength and ROM measurements as well as goal updates   Remaining deficits: Pain medial Rt thigh with long ambulation    Education / Equipment: HEP and dry  needling Plan: Patient agrees to discharge.  Patient goals were partially met. Patient is being  discharged due to being pleased with the current functional level.  ?????    Jeral Pinch, PT 10/23/20 1:17 PM

## 2020-10-28 DIAGNOSIS — H25813 Combined forms of age-related cataract, bilateral: Secondary | ICD-10-CM | POA: Diagnosis not present

## 2020-12-28 ENCOUNTER — Other Ambulatory Visit: Payer: Self-pay | Admitting: Sports Medicine

## 2020-12-28 DIAGNOSIS — M1711 Unilateral primary osteoarthritis, right knee: Secondary | ICD-10-CM

## 2021-01-29 DIAGNOSIS — Z23 Encounter for immunization: Secondary | ICD-10-CM | POA: Diagnosis not present

## 2021-02-10 ENCOUNTER — Ambulatory Visit (INDEPENDENT_AMBULATORY_CARE_PROVIDER_SITE_OTHER): Payer: Medicare Other

## 2021-02-10 ENCOUNTER — Other Ambulatory Visit: Payer: Self-pay

## 2021-02-10 ENCOUNTER — Ambulatory Visit (INDEPENDENT_AMBULATORY_CARE_PROVIDER_SITE_OTHER): Payer: Medicare Other | Admitting: Sports Medicine

## 2021-02-10 DIAGNOSIS — M1611 Unilateral primary osteoarthritis, right hip: Secondary | ICD-10-CM

## 2021-02-10 DIAGNOSIS — M25551 Pain in right hip: Secondary | ICD-10-CM | POA: Diagnosis not present

## 2021-02-10 DIAGNOSIS — M1711 Unilateral primary osteoarthritis, right knee: Secondary | ICD-10-CM | POA: Diagnosis not present

## 2021-02-10 NOTE — Assessment & Plan Note (Signed)
Amber Reynolds is also having pain in her anterior thigh, back, she does endorse that it radiates to the mid thigh. On exam she has very little internal rotation of the right hip, and internal rotation reproduces her pain consistent with a hip joint pain generator. The lack of IR indicates osteoarthritis. We injected her right hip as well, we will get some baseline x-rays, return in a month.

## 2021-02-10 NOTE — Progress Notes (Signed)
    Procedures performed today:    Procedure: Real-time Ultrasound Guided injection of the right knee Device: Samsung HS60  Verbal informed consent obtained.  Time-out conducted.  Noted no overlying erythema, induration, or other signs of local infection.  Skin prepped in a sterile fashion.  Local anesthesia: Topical Ethyl chloride.  With sterile technique and under real time ultrasound guidance:  Noted trace effusion, 1 cc Kenalog 40, 2 cc lidocaine, 2 cc bupivacaine injected easily Completed without difficulty  Advised to call if fevers/chills, erythema, induration, drainage, or persistent bleeding.  Images permanently stored and available for review in PACS.  Impression: Technically successful ultrasound guided injection.  Procedure: Real-time Ultrasound Guided injection of the right hip joint Device: Samsung HS60  Verbal informed consent obtained.  Time-out conducted.  Noted no overlying erythema, induration, or other signs of local infection.  Skin prepped in a sterile fashion.  Local anesthesia: Topical Ethyl chloride.  With sterile technique and under real time ultrasound guidance:  Noted arthritic joint, 1 cc Kenalog 40, 2 cc lidocaine, 2 cc bupivacaine injected easily Completed without difficulty  Advised to call if fevers/chills, erythema, induration, drainage, or persistent bleeding.  Images permanently stored and available for review in PACS.  Impression: Technically successful ultrasound guided injection.  Independent interpretation of notes and tests performed by another provider:   None.  Brief History, Exam, Impression, and Recommendations:    Primary osteoarthritis of right knee Persistent discomfort, medial joint line, failed meloxicam, physical therapy, injected today.  Primary osteoarthritis of right hip Amber Reynolds is also having pain in her anterior thigh, back, she does endorse that it radiates to the mid thigh. On exam she has very little internal rotation  of the right hip, and internal rotation reproduces her pain consistent with a hip joint pain generator. The lack of IR indicates osteoarthritis. We injected her right hip as well, we will get some baseline x-rays, return in a month.    ___________________________________________ Gwen Her. Dianah Field, M.D., ABFM., CAQSM. Primary Care and [place] Instructor of [place] of [facility] of Medicine

## 2021-02-10 NOTE — Assessment & Plan Note (Signed)
Persistent discomfort, medial joint line, failed meloxicam, physical therapy, injected today.

## 2021-03-10 ENCOUNTER — Other Ambulatory Visit: Payer: Self-pay

## 2021-03-10 ENCOUNTER — Ambulatory Visit (INDEPENDENT_AMBULATORY_CARE_PROVIDER_SITE_OTHER): Payer: Medicare Other | Admitting: Sports Medicine

## 2021-03-10 DIAGNOSIS — M1711 Unilateral primary osteoarthritis, right knee: Secondary | ICD-10-CM

## 2021-03-10 DIAGNOSIS — M1611 Unilateral primary osteoarthritis, right hip: Secondary | ICD-10-CM

## 2021-03-10 MED ORDER — TRAMADOL HCL 50 MG PO TABS: 50.0000 mg | ORAL_TABLET | Freq: Three times a day (TID) | ORAL | 0 refills | Status: DC | PRN

## 2021-03-10 NOTE — Assessment & Plan Note (Signed)
Fallan returns, she is a pleasant 75 year old female with hip osteoarthritis, we did an injection at the last visit she had good improvement, unfortunately continues to have some discomfort in the hip and thigh, she used some of her husband's tramadol which seemed to work well, not really using her own meloxicam. Because tramadol worked I am happy to continue this and give her her own supply, she can use tramadol with her meloxicam, and return to see me as needed.

## 2021-03-10 NOTE — Progress Notes (Signed)
    Procedures performed today:    None.  Independent interpretation of notes and tests performed by another provider:   None.  Brief History, Exam, Impression, and Recommendations:    Primary osteoarthritis of right hip Amber Reynolds returns, she is a pleasant 75 year old female with hip osteoarthritis, we did an injection at the last visit she had good improvement, unfortunately continues to have some discomfort in the hip and thigh, she used some of her husband's tramadol which seemed to work well, not really using her own meloxicam. Because tramadol worked I am happy to continue this and give her her own supply, she can use tramadol with her meloxicam, and return to see me as needed.  Primary osteoarthritis of right knee Pain-free after the injection at the last visit, follow-up as needed, if recurrence of pain within 3 months of injection we can switch to viscosupplementation.    ___________________________________________ Gwen Her. Dianah Field, M.D., ABFM., CAQSM. Primary Care and [place] Instructor of [place] of [facility] of Medicine

## 2021-03-10 NOTE — Assessment & Plan Note (Signed)
Pain-free after the injection at the last visit, follow-up as needed, if recurrence of pain within 3 months of injection we can switch to viscosupplementation.

## 2021-05-06 DIAGNOSIS — M9903 Segmental and somatic dysfunction of lumbar region: Secondary | ICD-10-CM | POA: Diagnosis not present

## 2021-05-06 DIAGNOSIS — M9905 Segmental and somatic dysfunction of pelvic region: Secondary | ICD-10-CM | POA: Diagnosis not present

## 2021-05-06 DIAGNOSIS — M5451 Vertebrogenic low back pain: Secondary | ICD-10-CM | POA: Diagnosis not present

## 2021-05-07 DIAGNOSIS — M9903 Segmental and somatic dysfunction of lumbar region: Secondary | ICD-10-CM | POA: Diagnosis not present

## 2021-05-07 DIAGNOSIS — M9905 Segmental and somatic dysfunction of pelvic region: Secondary | ICD-10-CM | POA: Diagnosis not present

## 2021-05-07 DIAGNOSIS — M5451 Vertebrogenic low back pain: Secondary | ICD-10-CM | POA: Diagnosis not present

## 2021-05-08 DIAGNOSIS — M9903 Segmental and somatic dysfunction of lumbar region: Secondary | ICD-10-CM | POA: Diagnosis not present

## 2021-05-08 DIAGNOSIS — M9905 Segmental and somatic dysfunction of pelvic region: Secondary | ICD-10-CM | POA: Diagnosis not present

## 2021-05-08 DIAGNOSIS — M5451 Vertebrogenic low back pain: Secondary | ICD-10-CM | POA: Diagnosis not present

## 2021-05-11 DIAGNOSIS — M9905 Segmental and somatic dysfunction of pelvic region: Secondary | ICD-10-CM | POA: Diagnosis not present

## 2021-05-11 DIAGNOSIS — M9903 Segmental and somatic dysfunction of lumbar region: Secondary | ICD-10-CM | POA: Diagnosis not present

## 2021-05-13 ENCOUNTER — Encounter: Payer: Self-pay | Admitting: Osteopathic Medicine

## 2021-05-13 ENCOUNTER — Encounter: Payer: Self-pay | Admitting: Family Medicine

## 2021-05-13 DIAGNOSIS — M9903 Segmental and somatic dysfunction of lumbar region: Secondary | ICD-10-CM | POA: Diagnosis not present

## 2021-05-13 DIAGNOSIS — M9905 Segmental and somatic dysfunction of pelvic region: Secondary | ICD-10-CM | POA: Diagnosis not present

## 2021-05-15 DIAGNOSIS — M9905 Segmental and somatic dysfunction of pelvic region: Secondary | ICD-10-CM | POA: Diagnosis not present

## 2021-05-15 DIAGNOSIS — M9903 Segmental and somatic dysfunction of lumbar region: Secondary | ICD-10-CM | POA: Diagnosis not present

## 2021-05-20 ENCOUNTER — Other Ambulatory Visit: Payer: Self-pay | Admitting: Sports Medicine

## 2021-05-20 DIAGNOSIS — M9903 Segmental and somatic dysfunction of lumbar region: Secondary | ICD-10-CM | POA: Diagnosis not present

## 2021-05-20 DIAGNOSIS — M9905 Segmental and somatic dysfunction of pelvic region: Secondary | ICD-10-CM | POA: Diagnosis not present

## 2021-05-20 DIAGNOSIS — M1611 Unilateral primary osteoarthritis, right hip: Secondary | ICD-10-CM

## 2021-05-20 NOTE — Telephone Encounter (Signed)
Last OV: 03/10/21 Last fill: 03/10/21 #21

## 2021-05-20 NOTE — Telephone Encounter (Signed)
Patient aware Rx sent to pharmacy. °

## 2021-05-22 DIAGNOSIS — M9905 Segmental and somatic dysfunction of pelvic region: Secondary | ICD-10-CM | POA: Diagnosis not present

## 2021-05-22 DIAGNOSIS — M9903 Segmental and somatic dysfunction of lumbar region: Secondary | ICD-10-CM | POA: Diagnosis not present

## 2021-05-25 DIAGNOSIS — M9903 Segmental and somatic dysfunction of lumbar region: Secondary | ICD-10-CM | POA: Diagnosis not present

## 2021-05-25 DIAGNOSIS — M9905 Segmental and somatic dysfunction of pelvic region: Secondary | ICD-10-CM | POA: Diagnosis not present

## 2021-05-27 DIAGNOSIS — M9905 Segmental and somatic dysfunction of pelvic region: Secondary | ICD-10-CM | POA: Diagnosis not present

## 2021-05-27 DIAGNOSIS — M9903 Segmental and somatic dysfunction of lumbar region: Secondary | ICD-10-CM | POA: Diagnosis not present

## 2021-05-29 DIAGNOSIS — M9905 Segmental and somatic dysfunction of pelvic region: Secondary | ICD-10-CM | POA: Diagnosis not present

## 2021-05-29 DIAGNOSIS — M9903 Segmental and somatic dysfunction of lumbar region: Secondary | ICD-10-CM | POA: Diagnosis not present

## 2021-06-01 DIAGNOSIS — M9903 Segmental and somatic dysfunction of lumbar region: Secondary | ICD-10-CM | POA: Diagnosis not present

## 2021-06-01 DIAGNOSIS — M9904 Segmental and somatic dysfunction of sacral region: Secondary | ICD-10-CM | POA: Diagnosis not present

## 2021-06-01 DIAGNOSIS — M9905 Segmental and somatic dysfunction of pelvic region: Secondary | ICD-10-CM | POA: Diagnosis not present

## 2021-06-01 DIAGNOSIS — M9902 Segmental and somatic dysfunction of thoracic region: Secondary | ICD-10-CM | POA: Diagnosis not present

## 2021-06-03 DIAGNOSIS — M9905 Segmental and somatic dysfunction of pelvic region: Secondary | ICD-10-CM | POA: Diagnosis not present

## 2021-06-03 DIAGNOSIS — M9903 Segmental and somatic dysfunction of lumbar region: Secondary | ICD-10-CM | POA: Diagnosis not present

## 2021-06-10 ENCOUNTER — Other Ambulatory Visit: Payer: Self-pay

## 2021-06-10 ENCOUNTER — Ambulatory Visit (INDEPENDENT_AMBULATORY_CARE_PROVIDER_SITE_OTHER): Payer: Medicare Other | Admitting: Sports Medicine

## 2021-06-10 DIAGNOSIS — M1611 Unilateral primary osteoarthritis, right hip: Secondary | ICD-10-CM

## 2021-06-10 MED ORDER — TRAMADOL HCL 50 MG PO TABS: 50.0000 mg | ORAL_TABLET | Freq: Three times a day (TID) | ORAL | 3 refills | Status: DC | PRN

## 2021-06-10 NOTE — Progress Notes (Signed)
    Procedures performed today:    None.  Independent interpretation of notes and tests performed by another provider:   None.  Brief History, Exam, Impression, and Recommendations:    Primary osteoarthritis of right hip This is a pleasant 75 year old female, she has known hip osteoarthritis, severe, she responded temporarily to a hip joint injection back in May. Tramadol seems to control her pain adequately in the interim. Considering the severity of her arthritis and the lack of prolonged response to the injection I think she is a candidate for arthroplasty, referral to Dr. Dorna Leitz, refilling tramadol. She did request another injection and I advised her it was not a great idea prior to arthroplasty. Return to see me as needed  Chronic process with exacerbation and pharmacologic intervention  ___________________________________________ Gwen Her. Dianah Field, M.D., ABFM., CAQSM. Primary Care and [place] Instructor of [place] of [facility] of Medicine

## 2021-06-10 NOTE — Assessment & Plan Note (Signed)
This is a pleasant 75 year old female, she has known hip osteoarthritis, severe, she responded temporarily to a hip joint injection back in May. Tramadol seems to control her pain adequately in the interim. Considering the severity of her arthritis and the lack of prolonged response to the injection I think she is a candidate for arthroplasty, referral to Dr. Dorna Leitz, refilling tramadol. She did request another injection and I advised her it was not a great idea prior to arthroplasty. Return to see me as needed

## 2021-06-18 DIAGNOSIS — M1611 Unilateral primary osteoarthritis, right hip: Secondary | ICD-10-CM | POA: Diagnosis not present

## 2021-06-23 DIAGNOSIS — Z23 Encounter for immunization: Secondary | ICD-10-CM | POA: Diagnosis not present

## 2021-06-25 ENCOUNTER — Telehealth: Payer: Self-pay | Admitting: Osteopathic Medicine

## 2021-06-25 NOTE — Telephone Encounter (Signed)
FYI: I gave surgical clearance to Amber Reynolds on 10/13 since Dr. Zigmund Daniel is covering Dr. Sheppard Coil this week.  Thank you.

## 2021-06-26 NOTE — Telephone Encounter (Signed)
Left voicemail message for patient to call back to get this surgical clearance appt scheduled with Dr Zigmund Daniel. AM

## 2021-07-02 ENCOUNTER — Encounter: Payer: Self-pay | Admitting: Family Medicine

## 2021-07-02 ENCOUNTER — Ambulatory Visit (INDEPENDENT_AMBULATORY_CARE_PROVIDER_SITE_OTHER): Payer: Medicare Other | Admitting: Family Medicine

## 2021-07-02 ENCOUNTER — Telehealth: Payer: Self-pay | Admitting: Family Medicine

## 2021-07-02 VITALS — BP 189/73 | HR 67 | Ht 60.0 in | Wt 144.0 lb

## 2021-07-02 DIAGNOSIS — M1612 Unilateral primary osteoarthritis, left hip: Secondary | ICD-10-CM | POA: Diagnosis not present

## 2021-07-02 DIAGNOSIS — Z131 Encounter for screening for diabetes mellitus: Secondary | ICD-10-CM | POA: Diagnosis not present

## 2021-07-02 DIAGNOSIS — M1611 Unilateral primary osteoarthritis, right hip: Secondary | ICD-10-CM | POA: Diagnosis not present

## 2021-07-02 DIAGNOSIS — Z01818 Encounter for other preprocedural examination: Secondary | ICD-10-CM

## 2021-07-02 DIAGNOSIS — R7309 Other abnormal glucose: Secondary | ICD-10-CM | POA: Diagnosis not present

## 2021-07-02 NOTE — Telephone Encounter (Signed)
BP really high at office visit today.  Recommend come back in 1 week so that we can recheck it.  If she goes to surgery with a pressure that is high then they will cancel her surgery so we need to make sure it is well controlled.  If she has a home blood pressure cuff she is also welcome to check it at home and bring those pressures in with her as well as her home cuff so that we can compare them.

## 2021-07-02 NOTE — Telephone Encounter (Signed)
Left voicemail for patient to call back to get this scheduled for a BP check in one week. AM

## 2021-07-02 NOTE — Progress Notes (Addendum)
Established Patient Office Visit  Subjective:  Patient ID: Amber Reynolds, female    DOB: 07-18-1946  Age: 75 y.o. MRN: [id number]  CC:  Chief Complaint  Patient presents with   surgical clerance    HPI Amber Reynolds presents for pre-operative clearance for right hip replacement.  She is going to have hip surgery with Dr. Dorna Leitz at [place].  She needs a preop and preoperative labs.  She has never had difficulty with anesthesia in the past she is only had sedation for her colonoscopy.  She is able to walk about a mile to a mile and a half up until a couple of weeks ago when her hip started bothering her more.  No recent chest pain or shortness of breath.  Not currently taking any blood thinners or over-the-counter NSAIDs.  Mostly uses Tylenol for pain control.  Last tetanus vaccine undocumented.  Her surgery is scheduled for November 16 and her preop is scheduled for November 3.  Past Medical History:  Diagnosis Date   History of colon polyps 09/20/2018    No past surgical history on file.  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 Reads a lot. Watches TV.    Social Determinants of Health   Financial Resource Strain: Low Risk    Difficulty of Paying Living Expenses: Not hard at all  Food Insecurity: No Food Insecurity    Worried About Charity fundraiser in the Last Year: Never true   [place] in the Last Year: Never true  Transportation Needs: No Transportation Needs   Lack of Transportation (Medical): No   Lack of Transportation (Non-Medical): No  Physical Activity: Sufficiently Active   Days of Exercise per Week: 4 days   Minutes of Exercise per Session: 60 min  Stress: No Stress Concern Present   Feeling of Stress : Not at all  Social Connections: Moderately Integrated   Frequency of Communication with Friends and Family: More than three times a week   Frequency of Social Gatherings with Friends and Family: Once a week   Attends Religious Services: More than 4 times per year   Active Member of [employer] or Organizations: No   Attends Archivist Meetings: Never   Marital Status: Married  Human resources officer Violence: Not At Risk   Fear of Current or Ex-Partner: No   Emotionally Abused: No   Physically Abused: No   Sexually Abused: No    Outpatient Medications Prior to Visit  Medication Sig Dispense Refill   Cholecalciferol (VITAMIN D3) 50 MCG (2000 UT) TABS Take by mouth.     Multiple Vitamin (MULTIVITAMIN) tablet Take 1 tablet by mouth daily.     Probiotic Product (PROBIOTIC-10  ULTIMATE PO) Take by mouth.     traMADol (ULTRAM) 50 MG tablet Take 1 tablet (50 mg total) by mouth 3 (three) times daily as needed. 90 tablet 3   meloxicam (MOBIC) 15 MG tablet TAKE 1 TABLET BY MOUTH EVERY MORNING WITH A MEAL FOR 2 WEEKS, THEN DAILY AS NEEDED FOR PAIN (Patient not taking: Reported on 06/10/2021) 30 tablet 3   No facility-administered medications prior to visit.    No Known Allergies  ROS Review of Systems    Objective:    Physical Exam Constitutional:      Appearance: Normal appearance. She is well-developed.  HENT:     Head: Normocephalic and atraumatic.     Right Ear: Tympanic membrane, ear canal and external ear normal.     Left Ear: Tympanic membrane, ear canal and external  ear normal.     Nose: Nose normal.     Mouth/Throat:     Mouth: Mucous membranes are moist.     Pharynx: Oropharynx is clear. No oropharyngeal exudate or posterior oropharyngeal erythema.  Eyes:     Conjunctiva/sclera: Conjunctivae normal.     Pupils: Pupils are equal, round, and reactive to light.  Neck:     Thyroid: No thyromegaly.     Vascular: No carotid bruit.     Comments: Small nodule palpated on the right side of the neck, possibly attached to the thyroid.  Cardiovascular:     Rate and Rhythm: Normal rate and regular rhythm.     Heart sounds: Normal heart sounds.  Pulmonary:     Effort: Pulmonary effort is normal.     Breath sounds: Normal breath sounds. No wheezing.  Abdominal:     General: Abdomen is flat. There is no distension.     Palpations: There is no mass.  Musculoskeletal:     Cervical back: No tenderness.  Lymphadenopathy:     Cervical: Cervical adenopathy present.  Skin:    General: Skin is warm and dry.  Neurological:     Mental Status: She is alert and oriented to person, place, and time.  Psychiatric:        Behavior: Behavior normal.    BP (!) 189/73   Pulse 67   Ht 5' (1.524 m)   Wt 144 lb (65.3 kg)   SpO2 100%   BMI 28.12 kg/m  Wt Readings from Last 3 Encounters:  07/02/21 144 lb (65.3 kg)  10/04/18 150 lb (68 kg)  09/20/18 147 lb 12.8 oz (67 kg)     Health Maintenance Due  Topic Date Due   TETANUS/TDAP  Never done   COLONOSCOPY (Pts 45-13yr Insurance coverage will need to be confirmed)  Never done   Zoster Vaccines- Shingrix (1 of 2) Never done   COVID-19 Vaccine (5 - Booster for PAlseaseries) 03/26/2021    There are no preventive care reminders to display for this patient.  Lab Results  Component Value Date   TSH 1.66 10/21/2020   Lab Results  Component Value Date   WBC 7.6 07/02/2021   HGB 15.6 (H) 07/02/2021   HCT 47.9 (H) 07/02/2021   MCV 89.5 07/02/2021   PLT 315 07/02/2021   Lab Results  Component Value Date    NA 144 07/02/2021   K 4.8 07/02/2021   CO2 27 07/02/2021   GLUCOSE 80 07/02/2021   BUN 12 07/02/2021   CREATININE 0.73 07/02/2021   BILITOT 0.5 07/02/2021   AST 21 07/02/2021   ALT 20 07/02/2021   PROT 6.6  07/02/2021   CALCIUM 9.4 07/02/2021   EGFR 86 07/02/2021   Lab Results  Component Value Date   CHOL 233 (H) 10/21/2020   Lab Results  Component Value Date   HDL 52 10/21/2020   Lab Results  Component Value Date   LDLCALC 160 (H) 10/21/2020   Lab Results  Component Value Date   TRIG 99 10/21/2020   Lab Results  Component Value Date   CHOLHDL 4.5 10/21/2020   Lab Results  Component Value Date   HGBA1C 5.1 07/02/2021      Assessment & Plan:   Problem List Items Addressed This Visit       Musculoskeletal and Integument   Primary osteoarthritis of right hip   Relevant Orders   COMPLETE METABOLIC PANEL WITH GFR (Completed)   INR/PT (Completed)   CBC with Differential/Platelet (Completed)   Hemoglobin A1c (Completed)   Other Visit Diagnoses     Pre-operative clearance    -  Primary   Relevant Orders   COMPLETE METABOLIC PANEL WITH GFR (Completed)   INR/PT (Completed)   CBC with Differential/Platelet (Completed)   Hemoglobin A1c (Completed)   EKG 12-Lead   Screening for diabetes mellitus       Relevant Orders   Hemoglobin A1c (Completed)   Abnormal glucose       Relevant Orders   COMPLETE METABOLIC PANEL WITH GFR (Completed)   INR/PT (Completed)   CBC with Differential/Platelet (Completed)   Hemoglobin A1c (Completed)      Preoperative clearance - based on clinical exam and history she is cleared for surgery.  Labs are pending and I should have those back first thing in the morning.  We discussed getting her Tdap updated before her surgery.  Also avoid NSAIDs and other over-the-counter medications that can thin blood.  She says she has stopped all supplements completely.  EKG performed today.  EKG shows rate of 64 bpm, normal sinus rhythm with no  acute ST-T wave changes.  Q wave in lead II I.  No old EKG for comparison in fact she says she has never had one before.  BP very elevated today. Recommend return for repeat BP before surgery to make sure at goal.   Please see attached labs and EKG. See attached form.   No orders of the defined types were placed in this encounter.   Follow-up: No follow-ups on file.    Beatrice Lecher, MD

## 2021-07-02 NOTE — Patient Instructions (Signed)
Please stop any blood thinning products 1 week prior to surgery this would include things like fish oil, ginseng, aspirin, Aleve, ibuprofen.  Please try to get your Tdap updated at your local pharmacy before surgery as well.

## 2021-07-03 ENCOUNTER — Telehealth: Payer: Self-pay | Admitting: *Deleted

## 2021-07-03 LAB — CBC WITH DIFFERENTIAL/PLATELET
Absolute Monocytes: 562 cells/uL (ref 200–950)
Basophils Absolute: 68 cells/uL (ref 0–200)
Basophils Relative: 0.9 %
Eosinophils Absolute: 228 cells/uL (ref 15–500)
Eosinophils Relative: 3 %
HCT: 47.9 % — ABNORMAL HIGH (ref 35.0–45.0)
Hemoglobin: 15.6 g/dL — ABNORMAL HIGH (ref 11.7–15.5)
Lymphs Abs: 2356 cells/uL (ref 850–3900)
MCH: 29.2 pg (ref 27.0–33.0)
MCHC: 32.6 g/dL (ref 32.0–36.0)
MCV: 89.5 fL (ref 80.0–100.0)
MPV: 9.6 fL (ref 7.5–12.5)
Monocytes Relative: 7.4 %
Neutro Abs: 4385 cells/uL (ref 1500–7800)
Neutrophils Relative %: 57.7 %
Platelets: 315 10*3/uL (ref 140–400)
RBC: 5.35 10*6/uL — ABNORMAL HIGH (ref 3.80–5.10)
RDW: 12 % (ref 11.0–15.0)
Total Lymphocyte: 31 %
WBC: 7.6 10*3/uL (ref 3.8–10.8)

## 2021-07-03 LAB — COMPLETE METABOLIC PANEL WITH GFR
AG Ratio: 1.8 (calc) (ref 1.0–2.5)
ALT: 20 U/L (ref 6–29)
AST: 21 U/L (ref 10–35)
Albumin: 4.2 g/dL (ref 3.6–5.1)
Alkaline phosphatase (APISO): 77 U/L (ref 37–153)
BUN: 12 mg/dL (ref 7–25)
CO2: 27 mmol/L (ref 20–32)
Calcium: 9.4 mg/dL (ref 8.6–10.4)
Chloride: 108 mmol/L (ref 98–110)
Creat: 0.73 mg/dL (ref 0.60–1.00)
Globulin: 2.4 g/dL (calc) (ref 1.9–3.7)
Glucose, Bld: 80 mg/dL (ref 65–99)
Potassium: 4.8 mmol/L (ref 3.5–5.3)
Sodium: 144 mmol/L (ref 135–146)
Total Bilirubin: 0.5 mg/dL (ref 0.2–1.2)
Total Protein: 6.6 g/dL (ref 6.1–8.1)
eGFR: 86 mL/min/{1.73_m2} (ref >=60)

## 2021-07-03 LAB — PROTIME-INR
INR: 0.9
Prothrombin Time: 9.7 s (ref 9.0–11.5)

## 2021-07-03 LAB — HEMOGLOBIN A1C
Hgb A1c MFr Bld: 5.1 % of total Hgb (ref <5.7)
Mean Plasma Glucose: 100 mg/dL
eAG (mmol/L): 5.5 mmol/L

## 2021-07-03 NOTE — Telephone Encounter (Signed)
orders for pre op completed,faxed,confirmation received and scanned into patient's chart.

## 2021-07-03 NOTE — Progress Notes (Signed)
Your lab work is within acceptable range and there are no concerning findings.   ?

## 2021-07-06 NOTE — Telephone Encounter (Signed)
[place] for nurse visit for BP. Bring home cuff with her.

## 2021-07-07 DIAGNOSIS — M1611 Unilateral primary osteoarthritis, right hip: Secondary | ICD-10-CM | POA: Diagnosis not present

## 2021-07-08 ENCOUNTER — Ambulatory Visit (INDEPENDENT_AMBULATORY_CARE_PROVIDER_SITE_OTHER): Payer: Medicare Other | Admitting: Medical-Surgical

## 2021-07-08 ENCOUNTER — Other Ambulatory Visit: Payer: Self-pay

## 2021-07-08 VITALS — BP 153/77 | HR 73 | Temp 97.9°F | Wt 144.0 lb

## 2021-07-08 DIAGNOSIS — I10 Essential (primary) hypertension: Secondary | ICD-10-CM | POA: Diagnosis not present

## 2021-07-08 MED ORDER — AMLODIPINE BESYLATE 5 MG PO TABS: ORAL_TABLET | ORAL | 0 refills | Status: DC

## 2021-07-08 NOTE — Progress Notes (Signed)
Attempted to contact patient regarding new bp rx. No answer, left a detailed vm msg for patient. Informed to stop by the pharmacy for new medication. Aware of directions on taking the rx. Direct call back info provided.

## 2021-07-08 NOTE — Progress Notes (Signed)
Patient is here for blood pressure check. Denies trouble sleeping, palpitations or medication problems. Patient did bring in separate 2 home bp machines. Blood pressure readings were out of range. Patient sat for 10 minutes for a bp recheck. Blood pressure continues to be out of range. Patient is aware that the clinic will call with any update. Patient advised to schedule an appointment to transition care to new provider.   Patient blood pressure home machine readings:  164/98 (69) (machine #1) 163/86 (70) (machine #2)

## 2021-07-08 NOTE — Progress Notes (Signed)
Please contact patient. Start medication as below.  Monitor home blood pressures with goal of 130/80 or less.  Return in 2 weeks for nurse visit for blood pressure check.  Contact our office with any questions or concerns.  Meds ordered this encounter  Medications   amLODipine (NORVASC) 5 MG tablet    Sig: Take 1/2 tablet (2.5mg ) daily for 7 days then increase to 1 tablet (5mg ) daily.    Dispense:  90 tablet    Refill:  0    Order Specific Question:   Supervising Provider    Answer:   MATTHEWS, CODY [[id number]]    Clearnce Sorrel, DNP, APRN, FNP-BC [facility]

## 2021-07-14 DIAGNOSIS — M1611 Unilateral primary osteoarthritis, right hip: Secondary | ICD-10-CM | POA: Diagnosis not present

## 2021-07-16 DIAGNOSIS — M1611 Unilateral primary osteoarthritis, right hip: Secondary | ICD-10-CM | POA: Diagnosis not present

## 2021-07-17 DIAGNOSIS — M1611 Unilateral primary osteoarthritis, right hip: Secondary | ICD-10-CM | POA: Diagnosis not present

## 2021-07-20 DIAGNOSIS — M1611 Unilateral primary osteoarthritis, right hip: Secondary | ICD-10-CM | POA: Diagnosis not present

## 2021-07-23 ENCOUNTER — Ambulatory Visit (INDEPENDENT_AMBULATORY_CARE_PROVIDER_SITE_OTHER): Payer: Medicare Other | Admitting: Family Medicine

## 2021-07-23 ENCOUNTER — Encounter: Payer: Self-pay | Admitting: Family Medicine

## 2021-07-23 ENCOUNTER — Other Ambulatory Visit: Payer: Self-pay

## 2021-07-23 DIAGNOSIS — I10 Essential (primary) hypertension: Secondary | ICD-10-CM

## 2021-07-23 NOTE — Patient Instructions (Signed)
Great to meet you today! Continue full tablet of amlodipine. I hope your surgery and recovery goes well!

## 2021-07-23 NOTE — Progress Notes (Signed)
Amber Reynolds - 75 y.o. female MRN [id number]  Date of birth: Jan 15, 1946  Subjective Chief Complaint  Patient presents with   Hypertension    HPI Amber Reynolds is a 75 year old female here today for follow-up of hypertension.  Blood pressure elevated at recent preop examination.  She is scheduled to have right hip arthroplasty next week.  She was initially started on half tab of 5 mg amlodipine however has increased this to a few tab.  Readings at home have been pretty good over the past week.  These ranged from 130-135/65-75.  Her blood pressure is elevated today in clinic.  She denies chest pain, shortness of breath, palpitations, headaches or vision changes.  ROS:  A comprehensive ROS was completed and negative except as noted per HPI  No Known Allergies  Past Medical History:  Diagnosis Date   History of colon polyps 09/20/2018    History reviewed. No pertinent surgical history.  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 Reads a lot. Watches TV.    Social Determinants of Health   Financial Resource Strain: Low Risk    Difficulty of Paying Living Expenses: Not hard at all  Food Insecurity: No Food Insecurity   Worried About Charity fundraiser in the Last Year: Never true   [place] in the Last Year: Never true  Transportation Needs: No Transportation Needs   Lack of Transportation (Medical): No   Lack of Transportation  (Non-Medical): No  Physical Activity: Sufficiently Active   Days of Exercise per Week: 4 days   Minutes of Exercise per Session: 60 min  Stress: No Stress Concern Present   Feeling of Stress : Not at all  Social Connections: Moderately Integrated   Frequency of Communication with Friends and Family: More than three times a week   Frequency of Social Gatherings with Friends and Family: Once a week   Attends Religious Services: More than 4 times per year   Active Member of [employer] or Organizations: No   Attends Music therapist: Never   Marital Status: Married    Family History  Problem Relation Age of Onset   Heart attack Mother    Diabetes Son     Health Maintenance  Topic Date Due   COLONOSCOPY (Pts 45-59yrs Insurance coverage will need to be confirmed)  Never done   Zoster Vaccines- Shingrix (1 of 2) Never done   COVID-19 Vaccine (5 - Booster for Pfizer series) 03/26/2021   TETANUS/TDAP  07/08/2031   Pneumonia Vaccine 85+ Years old  Completed   INFLUENZA VACCINE  Completed   DEXA SCAN  Completed   Hepatitis C Screening  Completed   HPV VACCINES  Aged Out     ----------------------------------------------------------------------------------------------------------------------------------------------------------------------------------------------------------------- Physical Exam BP (!) 158/70 (BP Location: Right Arm, Patient Position: Sitting, Cuff Size: Normal)   Pulse 61  Ht 5' (1.524 m)   Wt 145 lb (65.8 kg)   SpO2 99%   BMI 28.32 kg/m   Physical Exam Constitutional:      Appearance: Normal appearance.  Eyes:     General: No scleral icterus. Cardiovascular:     Rate and Rhythm: Normal rate and regular rhythm.  Pulmonary:     Effort: Pulmonary effort is normal.     Breath sounds: Normal breath sounds.  Musculoskeletal:     Cervical back: Neck supple.  Skin:    General: Skin is warm and dry.  Neurological:     General: No focal deficit  present.     Mental Status: She is alert.  Psychiatric:        Mood and Affect: Mood normal.        Behavior: Behavior normal.    ------------------------------------------------------------------------------------------------------------------------------------------------------------------------------------------------------------------- Assessment and Plan  Essential hypertension Blood pressure elevated in clinic however her readings at home have been well controlled.  I encouraged her to continue monitoring at home.  We will leave her at 5 mg of amlodipine.  I think she can proceed with surgery at this time.   No orders of the defined types were placed in this encounter.   Return in about 3 months (around 10/23/2021) for HTN.    This visit occurred during the SARS-CoV-2 public health emergency.  Safety protocols were in place, including screening questions prior to the visit, additional usage of staff PPE, and extensive cleaning of exam room while observing appropriate contact time as indicated for disinfecting solutions.

## 2021-07-23 NOTE — Assessment & Plan Note (Signed)
Blood pressure elevated in clinic however her readings at home have been well controlled.  I encouraged her to continue monitoring at home.  We will leave her at 5 mg of amlodipine.  I think she can proceed with surgery at this time.

## 2021-07-24 DIAGNOSIS — M1611 Unilateral primary osteoarthritis, right hip: Secondary | ICD-10-CM | POA: Diagnosis not present

## 2021-07-29 DIAGNOSIS — Z96641 Presence of right artificial hip joint: Secondary | ICD-10-CM | POA: Diagnosis not present

## 2021-07-29 DIAGNOSIS — M1611 Unilateral primary osteoarthritis, right hip: Secondary | ICD-10-CM | POA: Diagnosis not present

## 2021-07-31 DIAGNOSIS — M1611 Unilateral primary osteoarthritis, right hip: Secondary | ICD-10-CM | POA: Diagnosis not present

## 2021-08-03 DIAGNOSIS — M1611 Unilateral primary osteoarthritis, right hip: Secondary | ICD-10-CM | POA: Diagnosis not present

## 2021-08-05 DIAGNOSIS — M1611 Unilateral primary osteoarthritis, right hip: Secondary | ICD-10-CM | POA: Diagnosis not present

## 2021-08-10 DIAGNOSIS — M1611 Unilateral primary osteoarthritis, right hip: Secondary | ICD-10-CM | POA: Diagnosis not present

## 2021-08-11 DIAGNOSIS — M1611 Unilateral primary osteoarthritis, right hip: Secondary | ICD-10-CM | POA: Diagnosis not present

## 2021-08-12 DIAGNOSIS — M1611 Unilateral primary osteoarthritis, right hip: Secondary | ICD-10-CM | POA: Diagnosis not present

## 2021-08-19 DIAGNOSIS — M1611 Unilateral primary osteoarthritis, right hip: Secondary | ICD-10-CM | POA: Diagnosis not present

## 2021-08-21 DIAGNOSIS — M1611 Unilateral primary osteoarthritis, right hip: Secondary | ICD-10-CM | POA: Diagnosis not present

## 2021-08-25 DIAGNOSIS — M1611 Unilateral primary osteoarthritis, right hip: Secondary | ICD-10-CM | POA: Diagnosis not present

## 2021-08-27 DIAGNOSIS — M1611 Unilateral primary osteoarthritis, right hip: Secondary | ICD-10-CM | POA: Diagnosis not present

## 2021-09-01 DIAGNOSIS — M1611 Unilateral primary osteoarthritis, right hip: Secondary | ICD-10-CM | POA: Diagnosis not present

## 2021-09-21 DIAGNOSIS — Z23 Encounter for immunization: Secondary | ICD-10-CM | POA: Diagnosis not present

## 2021-10-03 ENCOUNTER — Other Ambulatory Visit: Payer: Self-pay | Admitting: Medical-Surgical

## 2021-10-23 ENCOUNTER — Encounter: Payer: Self-pay | Admitting: Family Medicine

## 2021-10-23 ENCOUNTER — Ambulatory Visit (INDEPENDENT_AMBULATORY_CARE_PROVIDER_SITE_OTHER): Payer: Medicare Other | Admitting: Family Medicine

## 2021-10-23 ENCOUNTER — Other Ambulatory Visit: Payer: Self-pay

## 2021-10-23 DIAGNOSIS — I10 Essential (primary) hypertension: Secondary | ICD-10-CM | POA: Diagnosis not present

## 2021-10-23 NOTE — Patient Instructions (Signed)
Check BP at home and let me know readings after 1 month.

## 2021-10-25 ENCOUNTER — Encounter: Payer: Self-pay | Admitting: Family Medicine

## 2021-10-25 NOTE — Assessment & Plan Note (Signed)
Blood pressure elevated today.  Tolerating amlodipine well.  Readings at home have been better controlled.  Asked her to send me a log of her readings over the next few weeks.  Low-sodium diet encouraged.

## 2021-10-25 NOTE — Progress Notes (Signed)
Amber Reynolds - 76 y.o. female MRN [id number]  Date of birth: 1946/03/17  Subjective Chief Complaint  Patient presents with   Hypertension    HPI Dalaya is a 76 year old female here today for follow-up of hypertension.  Current management with amlodipine.  Doing well with this and denies side effects.  She is checking blood pressures at home occasionally with readings at are fairly well controlled.  She denies any symptoms related to hypertension including chest pain, shortness of breath, palpitations, headaches or vision changes.  ROS:  A comprehensive ROS was completed and negative except as noted per HPI  No Known Allergies  Past Medical History:  Diagnosis Date   History of colon polyps 09/20/2018    No past surgical history on file.  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 Reads a lot. Watches TV.    Social Determinants of Health   Financial Resource Strain: Not on file  Food Insecurity: Not on file  Transportation Needs: Not on file  Physical Activity: Not on file  Stress: Not on file  Social Connections: Not on file    Family History  Problem Relation Age of Onset   Heart attack Mother    Diabetes Son     Health Maintenance  Topic Date Due   Zoster Vaccines- Shingrix (1 of 2) 01/20/2022 (Originally 09/09/1995)   TETANUS/TDAP  07/08/2031   Pneumonia Vaccine 101+ Years old  Completed   INFLUENZA VACCINE  Completed    DEXA SCAN  Completed   COVID-19 Vaccine  Completed   Hepatitis C Screening  Completed   HPV VACCINES  Aged Out     ----------------------------------------------------------------------------------------------------------------------------------------------------------------------------------------------------------------- Physical Exam BP (!) 160/84 (BP Location: Left Arm, Patient Position: Sitting, Cuff Size: Normal)    Pulse 67    Ht 5' (1.524 m)    Wt 148 lb (67.1 kg)    SpO2 98%    BMI 28.90 kg/m   Physical Exam Constitutional:      Appearance: Normal appearance.  Eyes:     General: No scleral icterus. Cardiovascular:     Rate and Rhythm: Normal rate and regular rhythm.  Pulmonary:     Effort: Pulmonary effort is normal.     Breath sounds: Normal breath sounds.  Musculoskeletal:     Cervical back: Neck supple.  Neurological:     Mental Status: She is alert.  Psychiatric:        Mood and Affect: Mood normal.        Behavior: Behavior normal.    ------------------------------------------------------------------------------------------------------------------------------------------------------------------------------------------------------------------- Assessment and Plan  Essential hypertension Blood pressure elevated today.  Tolerating amlodipine well.  Readings at home have been better controlled.  Asked her to send me a log of her readings over the next few weeks.  Low-sodium diet encouraged.   No orders of the defined types were placed in this encounter.  Return in about 6 months (around 04/22/2022) for HTN.    This visit occurred during the SARS-CoV-2 public health emergency.  Safety protocols were in place, including screening questions prior to the visit, additional usage of staff PPE, and extensive cleaning of exam room while observing appropriate contact time as indicated for disinfecting solutions.

## 2021-10-27 DIAGNOSIS — Z9889 Other specified postprocedural states: Secondary | ICD-10-CM | POA: Diagnosis not present

## 2021-10-27 DIAGNOSIS — M25551 Pain in right hip: Secondary | ICD-10-CM | POA: Diagnosis not present

## 2021-10-29 DIAGNOSIS — H35033 Hypertensive retinopathy, bilateral: Secondary | ICD-10-CM | POA: Diagnosis not present

## 2021-10-29 DIAGNOSIS — H16223 Keratoconjunctivitis sicca, not specified as Sjogren's, bilateral: Secondary | ICD-10-CM | POA: Diagnosis not present

## 2021-10-29 DIAGNOSIS — H524 Presbyopia: Secondary | ICD-10-CM | POA: Diagnosis not present

## 2021-10-29 DIAGNOSIS — I10 Essential (primary) hypertension: Secondary | ICD-10-CM | POA: Diagnosis not present

## 2021-10-29 DIAGNOSIS — H25813 Combined forms of age-related cataract, bilateral: Secondary | ICD-10-CM | POA: Diagnosis not present

## 2021-10-30 ENCOUNTER — Other Ambulatory Visit: Payer: Self-pay | Admitting: Family Medicine

## 2021-11-22 ENCOUNTER — Encounter: Payer: Self-pay | Admitting: Family Medicine

## 2022-01-21 ENCOUNTER — Other Ambulatory Visit: Payer: Self-pay | Admitting: Family Medicine

## 2022-04-23 ENCOUNTER — Encounter: Payer: Self-pay | Admitting: Family Medicine

## 2022-04-23 ENCOUNTER — Ambulatory Visit (INDEPENDENT_AMBULATORY_CARE_PROVIDER_SITE_OTHER): Payer: Medicare Other | Admitting: Family Medicine

## 2022-04-23 VITALS — BP 146/77 | HR 73 | Ht 60.0 in | Wt 154.0 lb

## 2022-04-23 DIAGNOSIS — E7800 Pure hypercholesterolemia, unspecified: Secondary | ICD-10-CM | POA: Diagnosis not present

## 2022-04-23 DIAGNOSIS — I10 Essential (primary) hypertension: Secondary | ICD-10-CM | POA: Diagnosis not present

## 2022-04-23 NOTE — Patient Instructions (Signed)
Great to see you today! Continue with current medication.  We'll be in touch with lab results.

## 2022-04-23 NOTE — Assessment & Plan Note (Addendum)
BP is mildly elevated today. I asked her to check readings at home for a couple of weeks and send these in.  Updating labs today.  Continue amlodipine at current strength.

## 2022-04-23 NOTE — Progress Notes (Signed)
Amber Reynolds - 76 y.o. female MRN [id number]  Date of birth: March 19, 1946  Subjective Chief Complaint  Patient presents with   Hypertension    HPI Amber Reynolds is a 76 y.o. female here today for follow up visit.   Continues on amlodipine for HTN.  She is doing well with this without side effects.  BP is mildly elevated today.  She has not had chest pain, shortness of breath ,palpitations, headache or vision changes.   S/p Hip replacement, has done well since this.   ROS:  A comprehensive ROS was completed and negative except as noted per HPI  No Known Allergies  Past Medical History:  Diagnosis Date   History of colon polyps 09/20/2018    History reviewed. No pertinent surgical history.  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 Reads a lot. Watches TV.    Social Determinants of Health   Financial Resource Strain: Low Risk  (10/08/2020)   Overall Financial Resource Strain (CARDIA)    Difficulty of Paying Living Expenses: Not hard at all  Food Insecurity: No Food Insecurity (10/08/2020)   Hunger Vital Sign    Worried About Running Out of Food in the Last Year: Never true    Ran Out of Food in the Last Year: Never true  Transportation Needs: No Transportation Needs (10/08/2020)   PRAPARE - Hydrologist (Medical): No    Lack of Transportation (Non-Medical): No  Physical Activity: Sufficiently Active  (10/08/2020)   Exercise Vital Sign    Days of Exercise per Week: 4 days    Minutes of Exercise per Session: 60 min  Stress: No Stress Concern Present (10/08/2020)   [place]    Feeling of Stress : Not at all  Social Connections: Moderately Integrated (10/08/2020)   Social Connection and Isolation Panel [NHANES]    Frequency of Communication with Friends and Family: More than three times a week    Frequency of Social Gatherings with Friends and Family: Once a week    Attends Religious Services: More than 4 times per year    Active Member of [employer] or Organizations: No    Attends Archivist Meetings: Never    Marital Status: Married    Family History  Problem Relation Age of Onset   Heart attack Mother    Diabetes Son     Health Maintenance  Topic Date Due   INFLUENZA VACCINE  04/13/2022   COVID-19 Vaccine (6 - Pfizer series) 07/14/2022 (Originally 01/19/2022)   Zoster Vaccines- Shingrix (1 of 2) 07/24/2022 (Originally 09/09/1995)   TETANUS/TDAP  07/08/2031   Pneumonia Vaccine 56+ Years old  Completed   DEXA SCAN  Completed   Hepatitis C Screening  Completed   HPV VACCINES  Aged Out     -----------------------------------------------------------------------------------------------------------------------------------------------------------------------------------------------------------------  Physical Exam BP (!) 146/77 (BP Location: Left Arm, Patient Position: Sitting, Cuff Size: Normal)   Pulse 73   Ht 5' (1.524 m)   Wt 154 lb (69.9 kg)   SpO2 93%   BMI 30.08 kg/m   Physical Exam Constitutional:      Appearance: Normal appearance.  Eyes:     General: No scleral icterus. Cardiovascular:     Rate and Rhythm: Normal rate and regular rhythm.  Pulmonary:     Effort: Pulmonary effort is normal.     Breath sounds: Normal breath sounds.  Neurological:     Mental Status: She is alert.   Psychiatric:        Mood and Affect: Mood normal.        Behavior: Behavior normal.     ------------------------------------------------------------------------------------------------------------------------------------------------------------------------------------------------------------------- Assessment and Plan  Essential hypertension BP is mildly elevated today. I asked her to check readings at home for a couple of weeks and send these in.  Updating labs today.  Continue amlodipine at current strength.    No orders of the defined types were placed in this encounter.   Return in about 6 months (around 10/24/2022) for HTN.    This visit occurred during the SARS-CoV-2 public health emergency.  Safety protocols were in place, including screening questions prior to the visit, additional usage of staff PPE, and extensive cleaning of exam room while observing appropriate contact time as indicated for disinfecting solutions.

## 2022-04-24 LAB — CBC WITH DIFFERENTIAL/PLATELET
Absolute Monocytes: 626 cells/uL (ref 200–950)
Basophils Absolute: 61 cells/uL (ref 0–200)
Basophils Relative: 0.9 %
Eosinophils Absolute: 170 cells/uL (ref 15–500)
Eosinophils Relative: 2.5 %
HCT: 45.2 % — ABNORMAL HIGH (ref 35.0–45.0)
Hemoglobin: 15.1 g/dL (ref 11.7–15.5)
Lymphs Abs: 1911 cells/uL (ref 850–3900)
MCH: 30.1 pg (ref 27.0–33.0)
MCHC: 33.4 g/dL (ref 32.0–36.0)
MCV: 90 fL (ref 80.0–100.0)
MPV: 10.8 fL (ref 7.5–12.5)
Monocytes Relative: 9.2 %
Neutro Abs: 4032 cells/uL (ref 1500–7800)
Neutrophils Relative %: 59.3 %
Platelets: 249 10*3/uL (ref 140–400)
RBC: 5.02 10*6/uL (ref 3.80–5.10)
RDW: 12.2 % (ref 11.0–15.0)
Total Lymphocyte: 28.1 %
WBC: 6.8 10*3/uL (ref 3.8–10.8)

## 2022-04-24 LAB — COMPLETE METABOLIC PANEL WITH GFR
AG Ratio: 1.9 (calc) (ref 1.0–2.5)
ALT: 21 U/L (ref 6–29)
AST: 19 U/L (ref 10–35)
Albumin: 4.1 g/dL (ref 3.6–5.1)
Alkaline phosphatase (APISO): 76 U/L (ref 37–153)
BUN: 22 mg/dL (ref 7–25)
CO2: 23 mmol/L (ref 20–32)
Calcium: 9.2 mg/dL (ref 8.6–10.4)
Chloride: 109 mmol/L (ref 98–110)
Creat: 0.69 mg/dL (ref 0.60–1.00)
Globulin: 2.2 g/dL (calc) (ref 1.9–3.7)
Glucose, Bld: 84 mg/dL (ref 65–99)
Potassium: 4.3 mmol/L (ref 3.5–5.3)
Sodium: 140 mmol/L (ref 135–146)
Total Bilirubin: 0.4 mg/dL (ref 0.2–1.2)
Total Protein: 6.3 g/dL (ref 6.1–8.1)
eGFR: 90 mL/min/{1.73_m2} (ref >=60)

## 2022-04-24 LAB — LIPID PANEL W/REFLEX DIRECT LDL
Cholesterol: 219 mg/dL — ABNORMAL HIGH (ref <200)
HDL: 50 mg/dL (ref >=50)
LDL Cholesterol (Calc): 145 mg/dL (calc) — ABNORMAL HIGH
Non-HDL Cholesterol (Calc): 169 mg/dL (calc) — ABNORMAL HIGH (ref <130)
Total CHOL/HDL Ratio: 4.4 (calc) (ref <5.0)
Triglycerides: 119 mg/dL (ref <150)

## 2022-05-05 ENCOUNTER — Encounter: Payer: Self-pay | Admitting: General Practice

## 2022-05-19 DIAGNOSIS — Z23 Encounter for immunization: Secondary | ICD-10-CM | POA: Diagnosis not present

## 2022-05-27 ENCOUNTER — Encounter: Payer: Self-pay | Admitting: Family Medicine

## 2022-06-02 DIAGNOSIS — Z23 Encounter for immunization: Secondary | ICD-10-CM | POA: Diagnosis not present

## 2022-07-09 ENCOUNTER — Telehealth: Payer: Self-pay | Admitting: Family Medicine

## 2022-07-09 NOTE — Telephone Encounter (Signed)
Called patient and LVM on 10/27 to schedule AWVS -Amber Reynolds

## 2022-10-01 ENCOUNTER — Telehealth: Payer: Self-pay | Admitting: General Practice

## 2022-10-01 ENCOUNTER — Ambulatory Visit (INDEPENDENT_AMBULATORY_CARE_PROVIDER_SITE_OTHER): Payer: Medicare Other | Admitting: Family Medicine

## 2022-10-01 VITALS — BP 176/67 | HR 69 | Ht 60.0 in | Wt 153.1 lb

## 2022-10-01 DIAGNOSIS — Z0000 Encounter for general adult medical examination without abnormal findings: Secondary | ICD-10-CM | POA: Diagnosis not present

## 2022-10-01 NOTE — Patient Instructions (Addendum)
[place] Maintenance Summary and Written Plan of Care  [name] ,  Thank you for allowing me to perform your Medicare Annual Wellness Visit and for your ongoing commitment to your health.   Health Maintenance & Immunization History Health Maintenance  Topic Date Due   COVID-19 Vaccine (7 - 2023-24 season) 10/17/2022 (Originally 07/28/2022)   Medicare Annual Wellness (AWV)  10/02/2023   DTaP/Tdap/Td (2 - Td or Tdap) 07/08/2031   Pneumonia Vaccine 32+ Years old  Completed   INFLUENZA VACCINE  Completed   DEXA SCAN  Completed   Hepatitis C Screening  Completed   Zoster Vaccines- Shingrix  Completed   HPV VACCINES  Aged Out   Immunization History  Administered Date(s) Administered   Fluad Quad(high Dose 65+) 06/06/2020   Influenza, High Dose Seasonal PF 06/23/2021   Influenza-Unspecified 08/27/2019, 06/23/2021, 05/19/2022   PFIZER(Purple Top)SARS-COV-2 Vaccination 10/15/2019, 11/13/2019, 07/16/2020, 01/29/2021   Pfizer Covid-19 Vaccine Bivalent Booster 49yr & up 09/21/2021, 06/02/2022   Pneumococcal Conjugate-13 10/21/2020   Pneumococcal Polysaccharide-23 09/25/2019   Tdap 07/07/2021   Zoster Recombinat (Shingrix) 05/19/2022, 09/20/2022    These are the patient goals that we discussed:  Goals Addressed               This Visit's Progress     Patient Stated (pt-stated)        Patient stated she said that she would like to loose 15 lbs and work on her memory.         This is a list of Health Maintenance Items that are overdue or due now: There are no preventive care reminders to display for this patient.    Orders/Referrals Placed Today: No orders of the defined types were placed in this encounter.  (Contact our referral department at [phone] you have not spoken with someone about your referral appointment within the next 5 days)    Follow-up Plan Follow-up with MLuetta Nutting DO as planned Discuss results of 6 CIT  with PCP at next appointment. Medicare wellness visit in one year.  AVS printed and given to the patient.      Health Maintenance, Female Adopting a healthy lifestyle and getting preventive care are important in promoting health and wellness. Ask your health care provider about: The right schedule for you to have regular tests and exams. Things you can do on your own to prevent diseases and keep yourself healthy. What should I know about diet, weight, and exercise? Eat a healthy diet  Eat a diet that includes plenty of vegetables, fruits, low-fat dairy products, and lean protein. Do not eat a lot of foods that are high in solid fats, added sugars, or sodium. Maintain a healthy weight Body mass index (BMI) is used to identify weight problems. It estimates body fat based on height and weight. Your health care provider can help determine your BMI and help you achieve or maintain a healthy weight. Get regular exercise Get regular exercise. This is one of the most important things you can do for your health. Most adults should: Exercise for at least 150 minutes each week. The exercise should increase your heart rate and make you sweat (moderate-intensity exercise). Do strengthening exercises at least twice a week. This is in addition to the moderate-intensity exercise. Spend less time sitting. Even light physical activity can be beneficial. Watch cholesterol and blood lipids Have your blood tested for lipids and cholesterol at 78years of age, then have this test every 5 years.  Have your cholesterol levels checked more often if: Your lipid or cholesterol levels are high. You are older than 78 years of age. You are at high risk for heart disease. What should I know about cancer screening? Depending on your health history and family history, you may need to have cancer screening at various ages. This may include screening for: Breast cancer. Cervical cancer. Colorectal cancer. Skin  cancer. Lung cancer. What should I know about heart disease, diabetes, and high blood pressure? Blood pressure and heart disease High blood pressure causes heart disease and increases the risk of stroke. This is more likely to develop in people who have high blood pressure readings or are overweight. Have your blood pressure checked: Every 3-5 years if you are 41-12 years of age. Every year if you are 105 years old or older. Diabetes Have regular diabetes screenings. This checks your fasting blood sugar level. Have the screening done: Once every three years after age 26 if you are at a normal weight and have a low risk for diabetes. More often and at a younger age if you are overweight or have a high risk for diabetes. What should I know about preventing infection? Hepatitis B If you have a higher risk for hepatitis B, you should be screened for this virus. Talk with your health care provider to find out if you are at risk for hepatitis B infection. Hepatitis C Testing is recommended for: Everyone born from [birth year] through 1965. Anyone with known risk factors for hepatitis C. Sexually transmitted infections (STIs) Get screened for STIs, including gonorrhea and chlamydia, if: You are sexually active and are younger than 78 years of age. You are older than 78 years of age and your health care provider tells you that you are at risk for this type of infection. Your sexual activity has changed since you were last screened, and you are at increased risk for chlamydia or gonorrhea. Ask your health care provider if you are at risk. Ask your health care provider about whether you are at high risk for HIV. Your health care provider may recommend a prescription medicine to help prevent HIV infection. If you choose to take medicine to prevent HIV, you should first get tested for HIV. You should then be tested every 3 months for as long as you are taking the medicine. Pregnancy If you are about to stop  having your period (premenopausal) and you may become pregnant, seek counseling before you get pregnant. Take 400 to 800 micrograms (mcg) of folic acid every day if you become pregnant. Ask for birth control (contraception) if you want to prevent pregnancy. Osteoporosis and menopause Osteoporosis is a disease in which the bones lose minerals and strength with aging. This can result in bone fractures. If you are 63 years old or older, or if you are at risk for osteoporosis and fractures, ask your health care provider if you should: Be screened for bone loss. Take a calcium or vitamin D supplement to lower your risk of fractures. Be given hormone replacement therapy (HRT) to treat symptoms of menopause. Follow these instructions at home: Alcohol use Do not drink alcohol if: Your health care provider tells you not to drink. You are pregnant, may be pregnant, or are planning to become pregnant. If you drink alcohol: Limit how much you have to: 0-1 drink a day. Know how much alcohol is in your drink. In the [place] one drink equals one 12 oz bottle of beer (355 mL), one 5  oz glass of wine (148 mL), or one 1 oz glass of hard liquor (44 mL). Lifestyle Do not use any products that contain nicotine or tobacco. These products include cigarettes, chewing tobacco, and vaping devices, such as e-cigarettes. If you need help quitting, ask your health care provider. Do not use street drugs. Do not share needles. Ask your health care provider for help if you need support or information about quitting drugs. General instructions Schedule regular health, dental, and eye exams. Stay current with your vaccines. Tell your health care provider if: You often feel depressed. You have ever been abused or do not feel safe at home. Summary Adopting a healthy lifestyle and getting preventive care are important in promoting health and wellness. Follow your health care provider's instructions about healthy diet,  exercising, and getting tested or screened for diseases. Follow your health care provider's instructions on monitoring your cholesterol and blood pressure. This information is not intended to replace advice given to you by your health care provider. Make sure you discuss any questions you have with your health care provider. Document Revised: 01/19/2021 Document Reviewed: 01/19/2021 Elsevier Patient Education  [place].

## 2022-10-01 NOTE — Telephone Encounter (Signed)
Patients blood pressure readings x 2 were both elevated during her medicare wellness visit. Discussed with Dr. Zigmund Daniel, ok to increase amlodipine 5 mg daily to 10 mg daily until her appt on October 25, 2022. Patient will bring a log her BP readings from home to that appointment.

## 2022-10-01 NOTE — Progress Notes (Signed)
MEDICARE ANNUAL WELLNESS VISIT  10/01/2022  Subjective:  Amber Reynolds is a 77 y.o. female patient of Amber Nutting, DO who had a Medicare Annual Wellness Visit today. Amber Reynolds is Retired and lives with their spouse. she has 3 children. she reports that she is socially active and does interact with friends/family regularly. she is moderately physically active and enjoys watching television and reading.  Patient Care Team: Amber Nutting, DO as PCP - General (Family Medicine)     10/01/2022   10:10 AM 10/08/2020    8:11 AM 09/25/2019    7:56 AM  Advanced Directives  Does Patient Have a Medical Advance Directive? Yes Yes Yes  Type of Advance Directive Living will [place];Living will [place];Living will  Does patient want to make changes to medical advance directive? No - Patient declined No - Patient declined No - Patient declined  Copy of [place] in Chart?  No - copy requested No - copy requested  Would patient like information on creating a medical advance directive?  No - Patient declined     Hospital Utilization Over the Past 12 Months: # of hospitalizations or ER visits: 0 # of surgeries: 0  Review of Systems    Patient reports that her overall health is unchanged when compared to last year.  Review of Systems: History obtained from chart review and the patient  All other systems negative.  Pain Assessment Pain : No/denies pain     Current Medications & Allergies (verified) Allergies as of 10/01/2022   No Known Allergies      Medication List        Accurate as of October 01, 2022 10:49 AM. If you have any questions, ask your nurse or doctor.          amLODipine 5 MG tablet Commonly known as: NORVASC TAKE 1/2 TABLET BY MOUTH DAILY FOR 7 DAYS THEN INCREASE TO 1 TABLET DAILY.   multivitamin tablet Take 1 tablet by mouth daily.   PROBIOTIC-10 ULTIMATE PO Take by mouth.        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 Reads a lot. Watches TV.    Social Determinants of Health   Financial Resource Strain: Low Risk  (09/27/2022)   Overall Financial Resource Strain (CARDIA)    Difficulty of Paying Living Expenses: Not hard at all  Food Insecurity: No Food Insecurity (09/27/2022)   Hunger Vital Sign  Worried About Charity fundraiser in the Last Year: Never true    [place] in the Last Year: Never true  Transportation Needs: No Transportation Needs (09/27/2022)   PRAPARE - Hydrologist (Medical): No    Lack of Transportation (Non-Medical): No  Physical Activity: Insufficiently Active (09/27/2022)   Exercise Vital Sign    Days of Exercise per Week: 3 days    Minutes of Exercise per Session: 30 min  Stress: No Stress Concern Present (09/27/2022)   [place]    Feeling of Stress : Not at all  Social Connections: Moderately Integrated (10/01/2022)   Social Connection and Isolation Panel [NHANES]     Frequency of Communication with Friends and Family: Twice a week    Frequency of Social Gatherings with Friends and Family: Once a week    Attends Religious Services: More than 4 times per year    Active Member of [employer] or Organizations: No    Attends Archivist Meetings: Never    Marital Status: Married    Activities of Daily Living    10/01/2022    9:44 AM 09/27/2022    1:49 PM  In your present state of health, do you have any difficulty performing the following activities:  Hearing?  0  Comment bilateral hearing aids   Vision?  0  Difficulty concentrating or making decisions?  0  Walking or climbing stairs?  0  Dressing or bathing?  0  Doing errands, shopping?  0  Preparing Food and eating ?  N  Using the Toilet?  N  In the past six months, have you accidently leaked urine?  N  Do you have problems with loss of bowel control?  N  Managing your Medications?  N  Managing your Finances?  N  Housekeeping or managing your Housekeeping?  N    Patient Education/Literacy How often do you need to have someone help you when you read instructions, pamphlets, or other written materials from your doctor or pharmacy?: 2 - Rarely What is the last grade level you completed in school?: 2 years of business school  Exercise Current Exercise Habits: Home exercise routine, Type of exercise: stretching;treadmill, Time (Minutes): 30, Frequency (Times/Week): 3, Weekly Exercise (Minutes/Week): 90, Intensity: Moderate, Exercise limited by: None identified  Diet Patient reports consuming 3 meals a day and 0 snack(s) a day Patient reports that her primary diet is: Regular Patient reports that she does have regular access to food.   Depression Screen    10/01/2022   10:14 AM 04/23/2022    8:17 AM 10/23/2021    9:13 AM 10/08/2020    8:15 AM 09/25/2019    8:00 AM 09/20/2018    9:26 AM  PHQ 2/9 Scores  PHQ - 2 Score 0 0 0 0 0 0  PHQ- 9 Score    0       Fall Risk    10/01/2022   10:14  AM 09/27/2022    1:49 PM 04/23/2022    8:17 AM 10/23/2021    9:13 AM 07/23/2021    2:05 PM  [place] in the past year? 0 0 0 0 0  Number falls in past yr: 0 0 0 0 0  Injury with Fall? 0  0 0 0  Risk for fall due to : No Fall Risks  No Fall Risks No Fall Risks No Fall Risks  Follow  up Falls evaluation completed  Falls evaluation completed Falls evaluation completed Falls evaluation completed     Objective:   BP (!) 176/67 (BP Location: Left Arm, Patient Position: Sitting, Cuff Size: Large)   Pulse 69   Ht 5' (1.524 m)   Wt 153 lb 1.3 oz (69.4 kg)   SpO2 100%   BMI 29.90 kg/m   Last Weight  Most recent update: 10/01/2022 10:08 AM    Weight  69.4 kg (153 lb 1.3 oz)             Body mass index is 29.9 kg/m.  Hearing/Vision  Shaquasha did not have difficulty with hearing/understanding during the face-to-face interview Tanay did not have difficulty with her vision during the face-to-face interview Reports that she has had a formal eye exam by an eye care professional within the past year Reports that she has had a formal hearing evaluation within the past year  Cognitive Function:    10/01/2022   10:17 AM 10/08/2020    8:20 AM 09/25/2019    8:02 AM  6CIT Screen  What Year? 4 points 0 points 0 points  What month? 3 points 0 points 0 points  What time? 0 points 0 points 0 points  Count back from 20 2 points 0 points 0 points  Months in reverse 2 points 0 points 4 points  Repeat phrase 10 points 10 points 6 points  Total Score 21 points 10 points 10 points    Normal Cognitive Function Screening: No: Patient has an appointment with PCP scheduled for February.  (Normal:0-7, Significant for Dysfunction: >8)  Immunization & Health Maintenance Record Immunization History  Administered Date(s) Administered   Fluad Quad(high Dose 65+) 06/06/2020   Influenza, High Dose Seasonal PF 06/23/2021   Influenza-Unspecified 08/27/2019, 06/23/2021, 05/19/2022   PFIZER(Purple  Top)SARS-COV-2 Vaccination 10/15/2019, 11/13/2019, 07/16/2020, 01/29/2021   Pfizer Covid-19 Vaccine Bivalent Booster 53yr & up 09/21/2021, 06/02/2022   Pneumococcal Conjugate-13 10/21/2020   Pneumococcal Polysaccharide-23 09/25/2019   Tdap 07/07/2021   Zoster Recombinat (Shingrix) 05/19/2022, 09/20/2022    Health Maintenance  Topic Date Due   COVID-19 Vaccine (7 - 2023-24 season) 10/17/2022 (Originally 07/28/2022)   Medicare Annual Wellness (AWV)  10/02/2023   DTaP/Tdap/Td (2 - Td or Tdap) 07/08/2031   Pneumonia Vaccine 78 Years old  Completed   INFLUENZA VACCINE  Completed   DEXA SCAN  Completed   Hepatitis C Screening  Completed   Zoster Vaccines- Shingrix  Completed   HPV VACCINES  Aged Out       Assessment  This is a routine wellness examination for KLILYTH LAWYER  Health Maintenance: Due or Overdue There are no preventive care reminders to display for this patient.   KJeanella Flatterydoes not need a referral for Community Assistance: Care Management:   no Social Work:    no Prescription Assistance:  no Nutrition/Diabetes Education:  no   Plan:  Personalized Goals  Goals Addressed               This Visit's Progress     Patient Stated (pt-stated)        Patient stated she said that she would like to loose 15 lbs and work on her memory.       Personalized Health Maintenance & Screening Recommendations  There are no preventive care reminders to display for this patient.  Lung Cancer Screening Recommended: no (Low Dose CT Chest recommended if Age 78-80years, 30 pack-year currently smoking OR have quit w/in  past 15 years) Hepatitis C Screening recommended: no HIV Screening recommended: no  Advanced Directives: Written information was not given per the patient's request.  Referrals & Orders No orders of the defined types were placed in this encounter.   Follow-up Plan Follow-up with Amber Nutting, DO as planned Discuss results of 6 CIT and  increased blood pressure with PCP at next appointment. Medicare wellness visit in one year.  AVS printed and given to the patient.   I have personally reviewed and noted the following in the patient's chart:   Medical and social history Use of alcohol, tobacco or illicit drugs  Current medications and supplements Functional ability and status Nutritional status Physical activity Advanced directives List of other physicians Hospitalizations, surgeries, and ER visits in previous 12 months Vitals Screenings to include cognitive, depression, and falls Referrals and appointments  In addition, I have reviewed and discussed with patient certain preventive protocols, quality metrics, and best practice recommendations. A written personalized care plan for preventive services as well as general preventive health recommendations were provided to patient.     Tinnie Gens, RN BSN  10/01/2022

## 2022-10-25 ENCOUNTER — Encounter: Payer: Self-pay | Admitting: Family Medicine

## 2022-10-25 ENCOUNTER — Ambulatory Visit (INDEPENDENT_AMBULATORY_CARE_PROVIDER_SITE_OTHER): Payer: Medicare Other | Admitting: Family Medicine

## 2022-10-25 VITALS — BP 139/79 | HR 72 | Ht 60.0 in | Wt 155.4 lb

## 2022-10-25 DIAGNOSIS — R4189 Other symptoms and signs involving cognitive functions and awareness: Secondary | ICD-10-CM

## 2022-10-25 DIAGNOSIS — I10 Essential (primary) hypertension: Secondary | ICD-10-CM | POA: Diagnosis not present

## 2022-10-25 MED ORDER — AMLODIPINE BESYLATE 10 MG PO TABS: ORAL_TABLET | ORAL | 3 refills | Status: DC

## 2022-10-25 NOTE — Assessment & Plan Note (Signed)
BP is mildly elevated in clinic.  Readings at home are well controlled.  Will continue amlodipine at 36m daily.  F/u in 6 months.

## 2022-10-25 NOTE — Assessment & Plan Note (Signed)
MOCA is 14/30 today.  She and her husband have not noted any significant abnormalities at home. Checking labs today.  Orders Placed This Encounter  Procedures   TSH   B12   RPR  Discussed referral to neurology if labs are normal.

## 2022-10-25 NOTE — Patient Instructions (Signed)
Continue the amlodipine at 82m daily.  We'll be in touch with lab results. See me again in 6 months or sooner if needed

## 2022-10-25 NOTE — Progress Notes (Signed)
Amber Reynolds - 77 y.o. female MRN [id number]  Date of birth: 1946/08/05  Subjective Chief Complaint  Patient presents with   Follow-up    6 month f/u blood pressure     HPI DESARE DIERS is a 77 y.o. female here today for follow up visit.   She had medicare AWV recently and was noted to have elevated 6-CIT.  She has not noted significant cognitive changes.  Her husband accompanies her today and reports that he has noted some mild short term memory change but nothing too significant..  There is no family history of dementia.  No prior history of thyroid disorder or known vitamin deficiency.   She has increased her amlodipine to 44m daily.  She is tolerating this well.  She brings in home BP cuff today.  Readings at home have been well controlled. Denies symptoms related to HTN including chest pain, shortness of breath, palpitations, headache or vision changes.   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 Reads a lot. Watches TV.    Social Determinants of Health   Financial Resource Strain: Low Risk   (09/27/2022)   Overall Financial Resource Strain (CARDIA)    Difficulty of Paying Living Expenses: Not hard at all  Food Insecurity: No Food Insecurity (09/27/2022)   Hunger Vital Sign    Worried About Running Out of Food in the Last Year: Never true    Ran Out of Food in the Last Year: Never true  Transportation Needs: No Transportation Needs (09/27/2022)   PRAPARE - THydrologist(Medical): No    Lack of Transportation (Non-Medical): No  Physical Activity: Insufficiently Active (09/27/2022)   Exercise Vital Sign    Days of Exercise per Week: 3 days    Minutes of Exercise per Session: 30 min  Stress: No Stress Concern Present (09/27/2022)   [place]   Feeling of Stress : Not at all  Social Connections: Moderately Integrated (10/01/2022)   Social Connection and Isolation Panel [NHANES]    Frequency of Communication with Friends and Family: Twice a week    Frequency of Social Gatherings with Friends and Family: Once a week    Attends Religious Services: More than 4 times per year    Active Member of [employer] Organizations: No    Attends CArchivistMeetings: Never    Marital Status: Married    Family  History  Problem Relation Age of Onset   Heart attack Mother    Diabetes Son     Health Maintenance  Topic Date Due   COVID-19 Vaccine (7 - 2023-24 season) 12/06/2022 (Originally 07/28/2022)   Medicare Annual Wellness (AWV)  10/02/2023   DTaP/Tdap/Td (2 - Td or Tdap) 07/08/2031   Pneumonia Vaccine 61+ Years old  Completed   INFLUENZA VACCINE  Completed   DEXA SCAN  Completed   Hepatitis C Screening  Completed   Zoster Vaccines- Shingrix  Completed   HPV VACCINES  Aged Out      ----------------------------------------------------------------------------------------------------------------------------------------------------------------------------------------------------------------- Physical Exam BP 139/79   Pulse 72   Ht 5' (1.524 m)   Wt 155 lb 6.4 oz (70.5 kg)   SpO2 97%   BMI 30.35 kg/m   Physical Exam Constitutional:      Appearance: Normal appearance.  HENT:     Head: Normocephalic and atraumatic.  Eyes:     General: No scleral icterus. Cardiovascular:     Rate and Rhythm: Normal rate and regular rhythm.  Pulmonary:     Effort: Pulmonary effort is normal.     Breath sounds: Normal breath sounds.  Musculoskeletal:     Cervical back: Neck supple.  Neurological:     Mental Status: She is alert.  Psychiatric:        Mood and Affect: Mood normal.        Behavior: Behavior normal.     ------------------------------------------------------------------------------------------------------------------------------------------------------------------------------------------------------------------- Assessment and Plan  Cognitive change MOCA is 14/30 today.  She and her husband have not noted any significant abnormalities at home. Checking labs today.  Orders Placed This Encounter  Procedures   TSH   B12   RPR  Discussed referral to neurology if labs are normal.   Essential hypertension BP is mildly elevated in clinic.  Readings at home are well controlled.  Will continue amlodipine at 66m daily.  F/u in 6 months.    Meds ordered this encounter  Medications   amLODipine (NORVASC) 10 MG tablet    Sig: Take 175mpo daily for blood pressure    Dispense:  90 tablet    Refill:  3    Return in about 6 months (around 04/25/2023) for HTN.    This visit occurred during the SARS-CoV-2 public health emergency.  Safety protocols were in place, including screening questions prior to the visit, additional usage of staff PPE, and extensive  cleaning of exam room while observing appropriate contact time as indicated for disinfecting solutions.

## 2022-10-26 LAB — VITAMIN B12: Vitamin B-12: 371 pg/mL (ref 200–1100)

## 2022-10-26 LAB — RPR: RPR Ser Ql: NONREACTIVE

## 2022-10-26 LAB — TSH: TSH: 1.49 mIU/L (ref 0.40–4.50)

## 2022-11-15 IMAGING — DX DG KNEE 1-2V*L*
2 series · 2 of 2 positions shown · non-contrast
Comparison: Contralateral knee AP and lateral views.

CLINICAL DATA: Chronic knee pain.

EXAM:
LEFT KNEE - 1-2 VIEW

[knee ap bilat standing (1 of 2)]
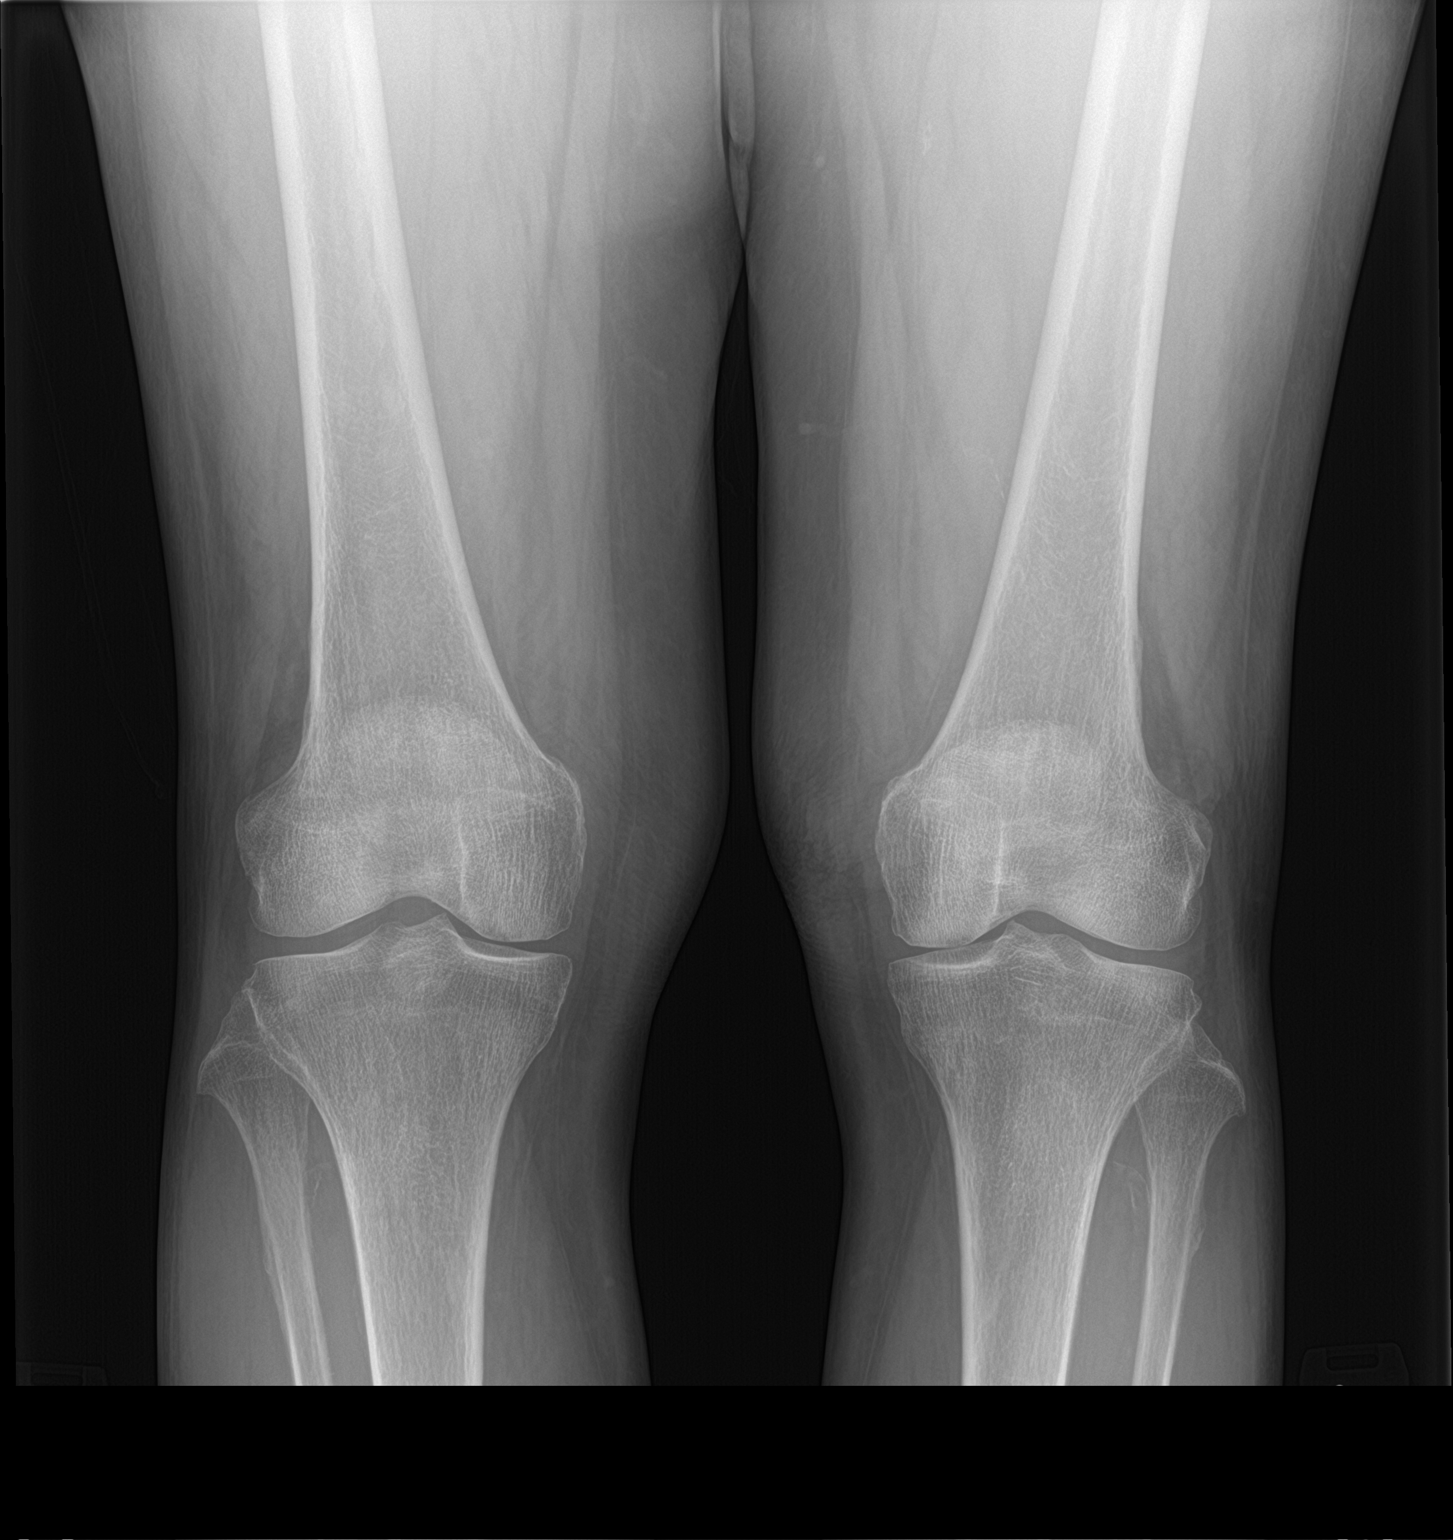

[knee ap bilat standing (2 of 2)]
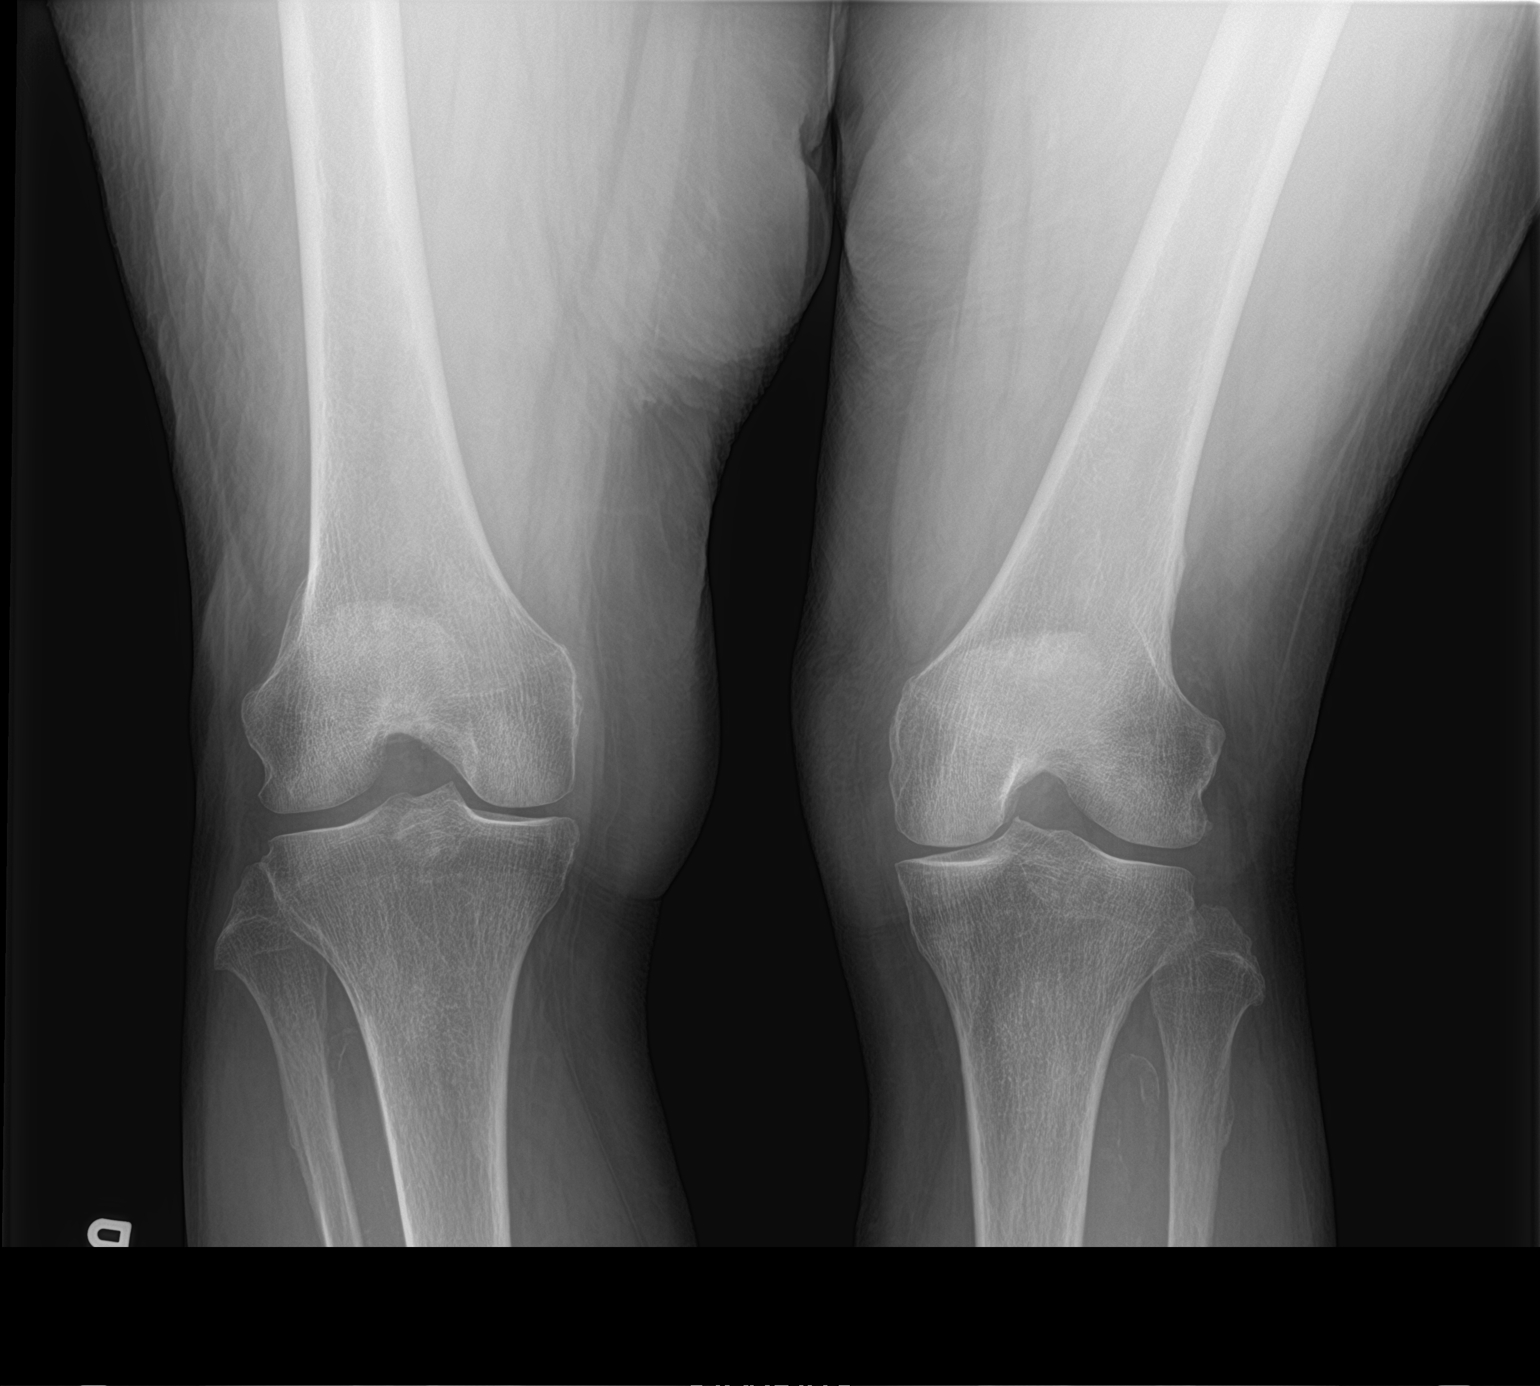

[2 of 2 positions shown; findings below may reference images not displayed]

FINDINGS: AP standing and tunnel views of the bilateral knees without acute
process. Some medial compartment joint space narrowing bilaterally
slightly worse on the LEFT than the RIGHT.

Soft tissues are unremarkable.
IMPRESSION: Mild medial compartment joint space narrowing bilaterally slightly
worse on the LEFT than the RIGHT.

## 2022-11-15 IMAGING — DX DG KNEE COMPLETE 4+V*R*
4 series · 4 of 4 positions shown · non-contrast
Comparison: Bilateral knee, AP and Temur views for comparison.

CLINICAL DATA: Chronic knee pain.

EXAM:
RIGHT KNEE - COMPLETE 4+ VIEW

[knee lat]
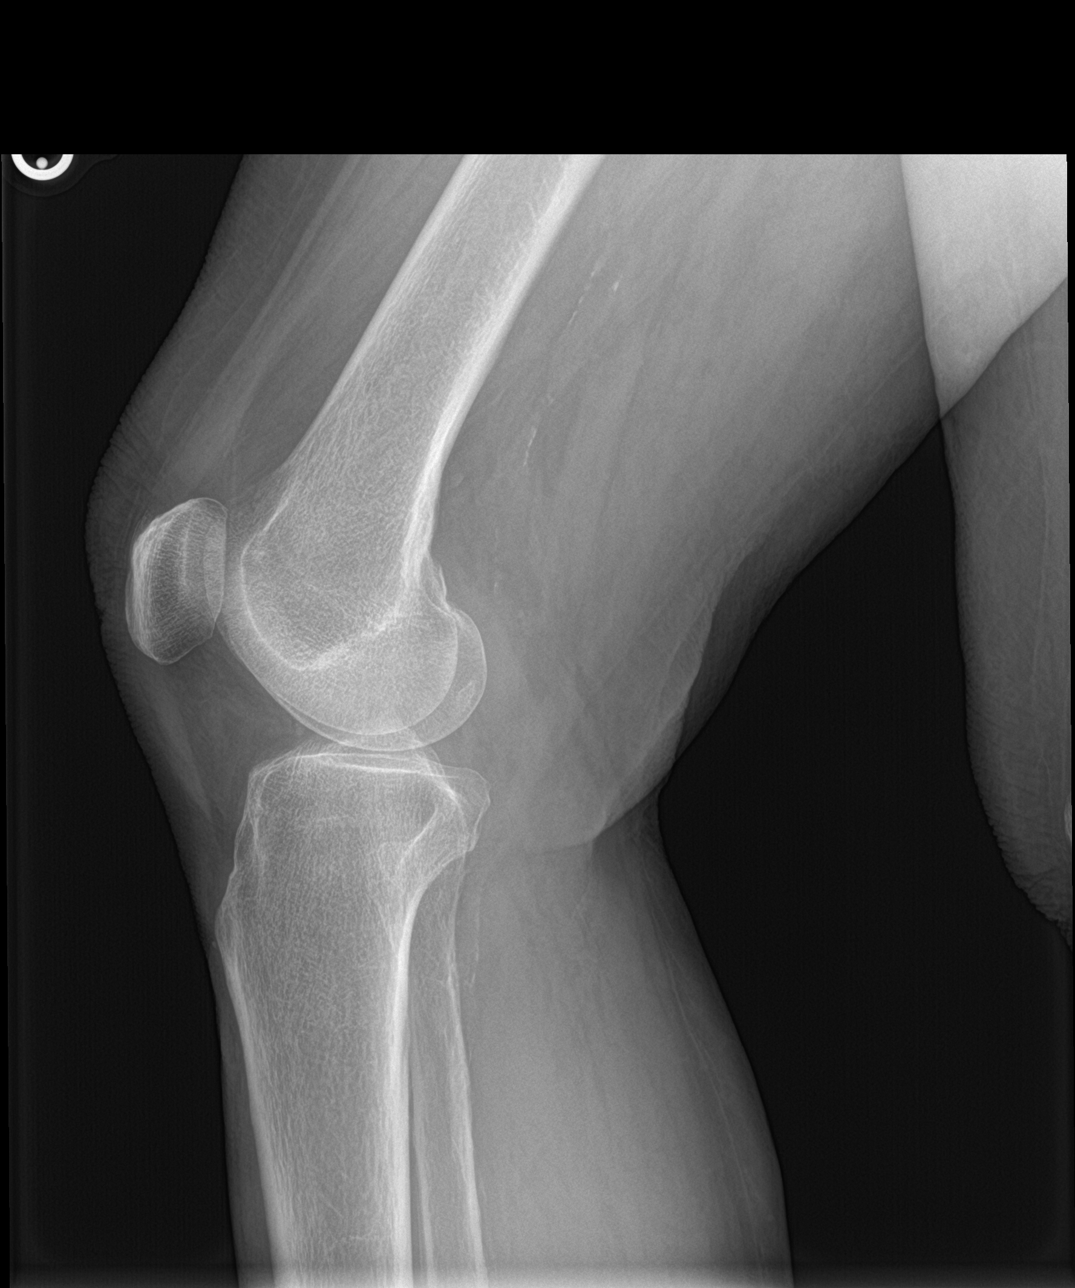

[knee sunrise]
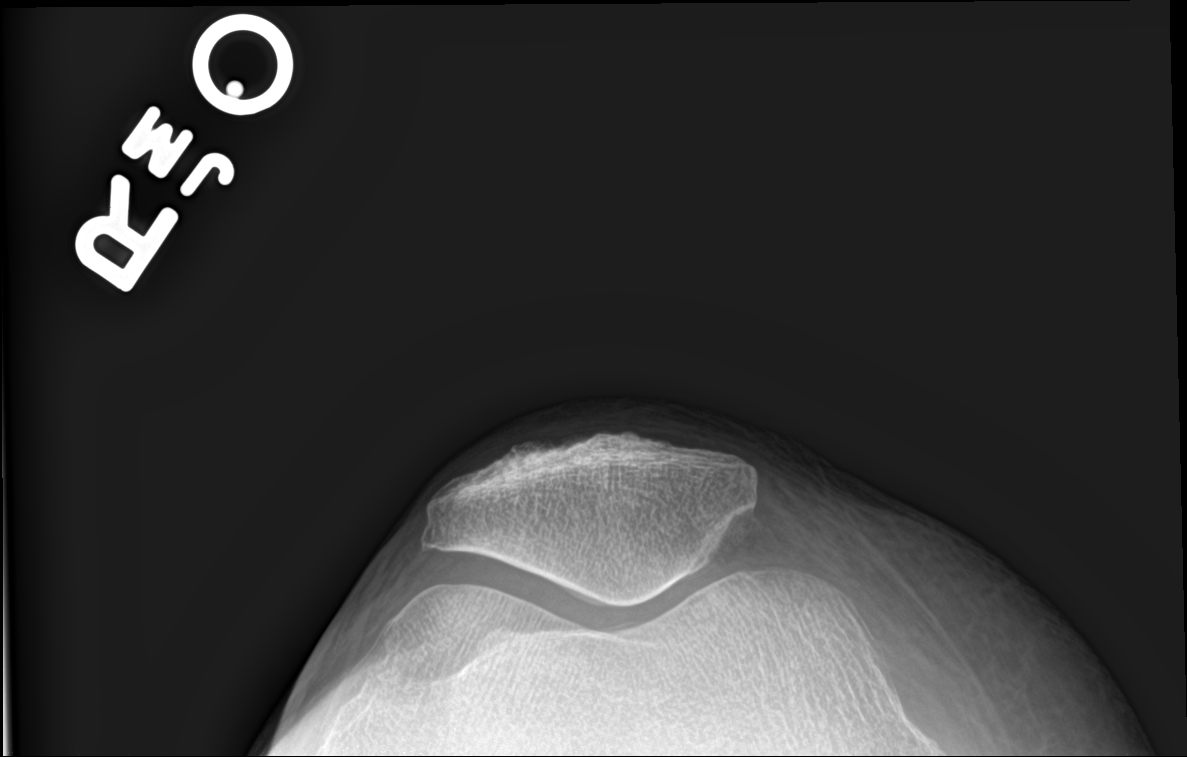

[knee ap bilat standing (1 of 2)]
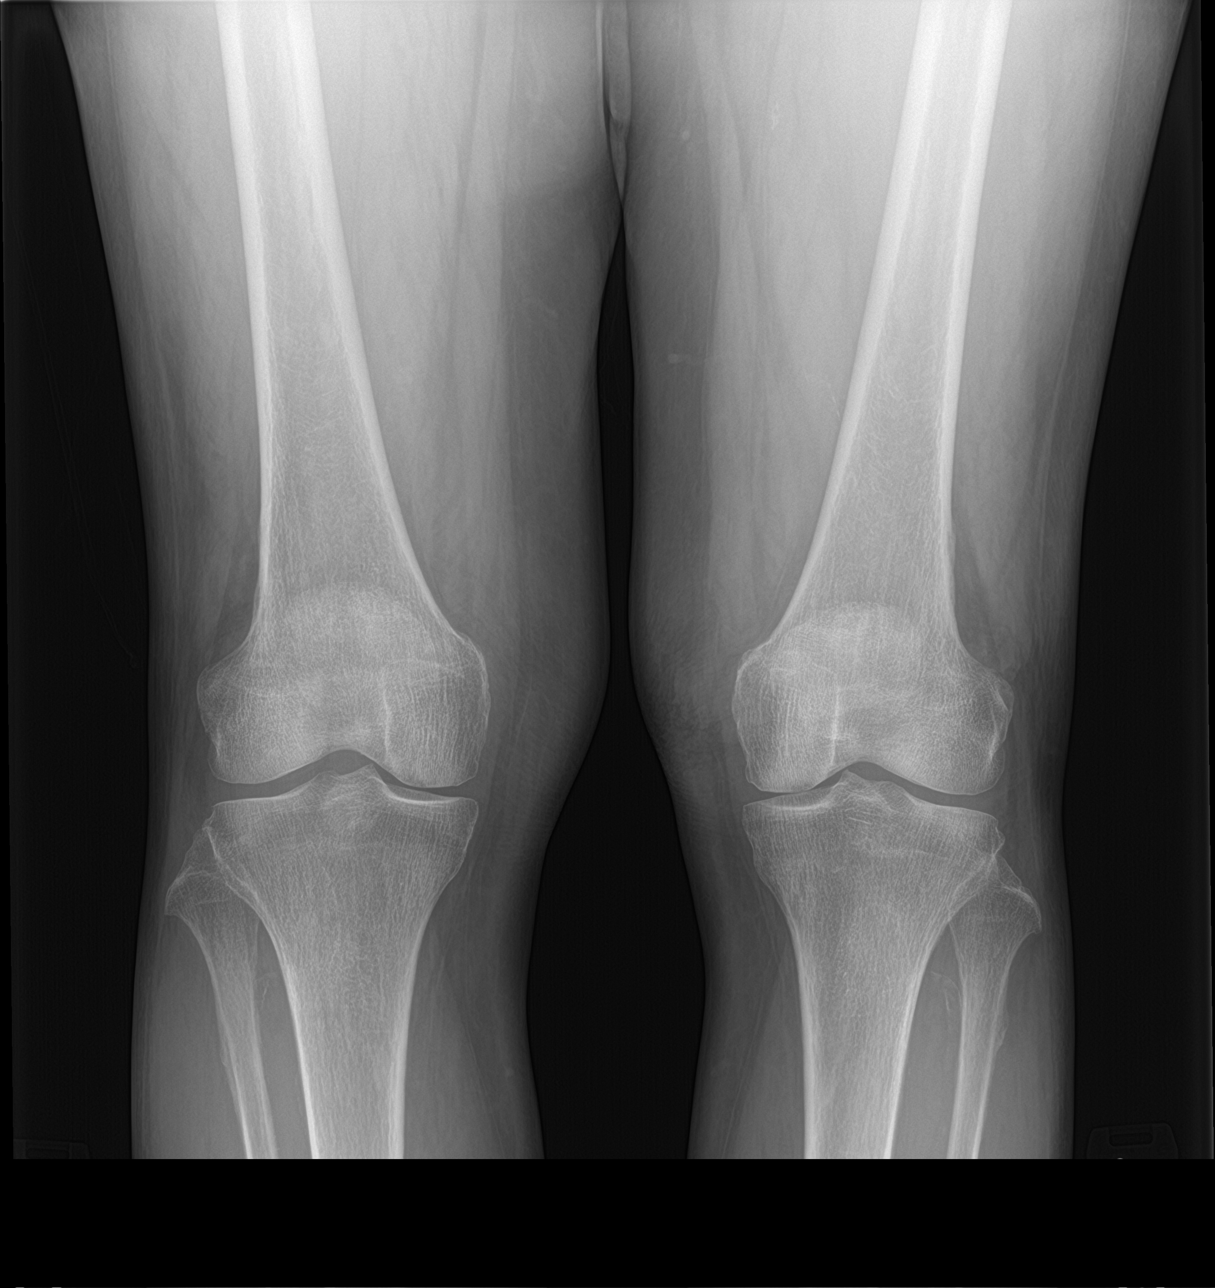

[knee ap bilat standing (2 of 2)]
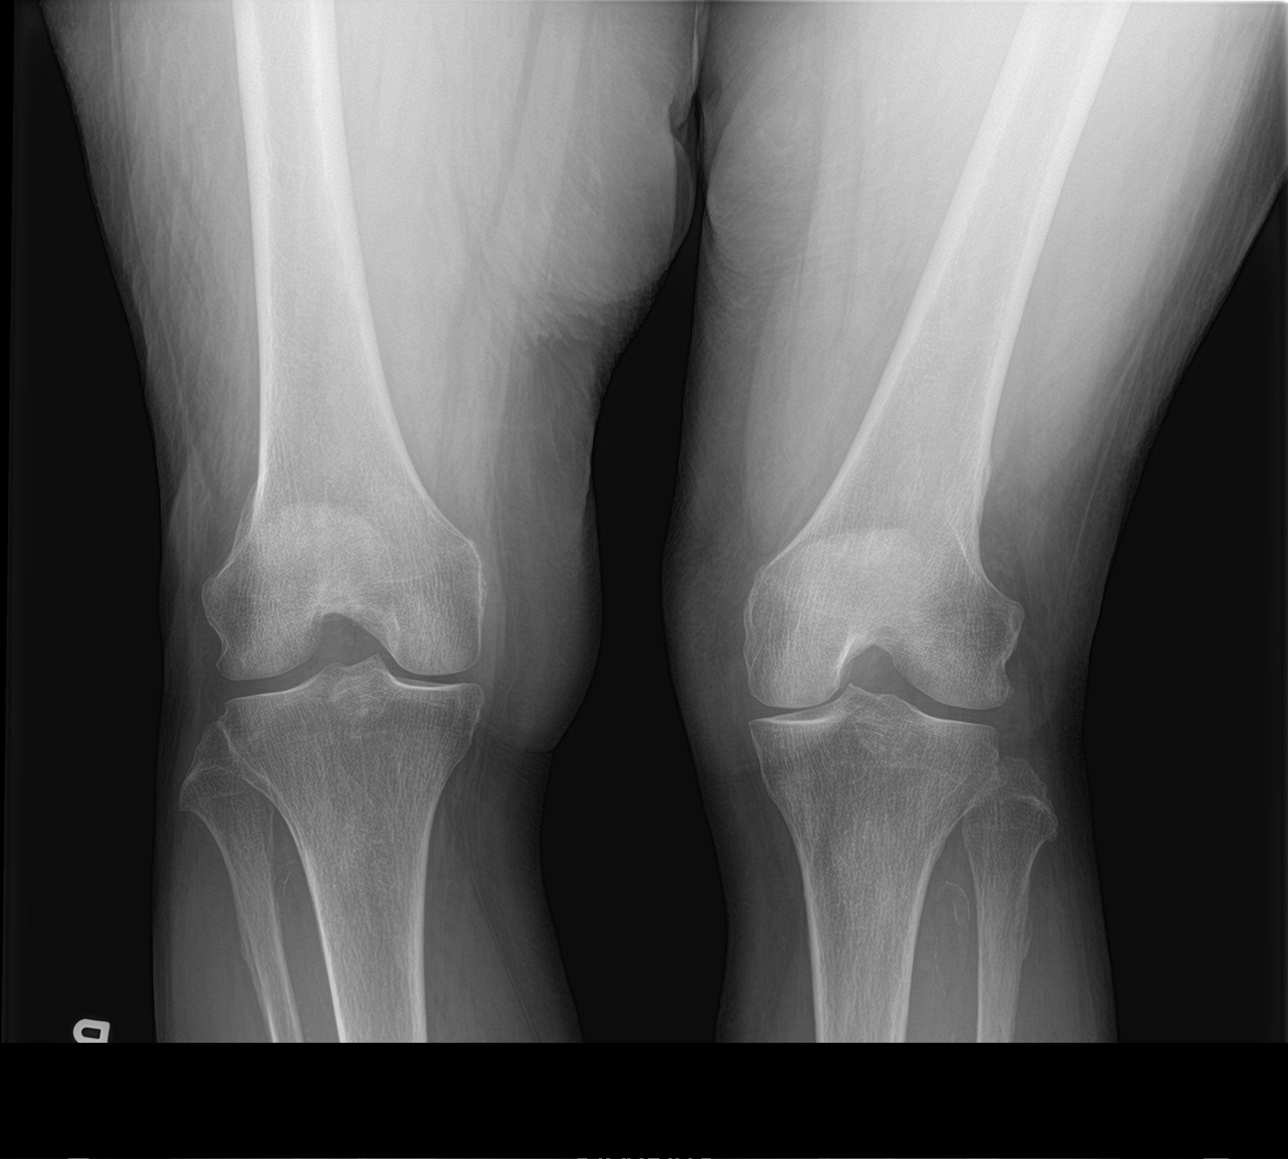

[4 of 4 positions shown; findings below may reference images not displayed]

FINDINGS: Medial compartment joint space narrowing slightly worse on the LEFT.
No sign of acute fracture or dislocation. No joint effusion.
Vascular calcifications in the soft tissues about the knee.
IMPRESSION: Mild degenerative changes. Comparison with LEFT knee shows perhaps
slightly more joint space narrowing in the medial compartment.

## 2023-01-13 ENCOUNTER — Other Ambulatory Visit: Payer: Self-pay | Admitting: Family Medicine

## 2023-01-13 DIAGNOSIS — I10 Essential (primary) hypertension: Secondary | ICD-10-CM

## 2023-04-12 ENCOUNTER — Encounter: Payer: Self-pay | Admitting: Family Medicine

## 2023-04-25 ENCOUNTER — Ambulatory Visit (INDEPENDENT_AMBULATORY_CARE_PROVIDER_SITE_OTHER): Payer: Medicare Other | Admitting: Family Medicine

## 2023-04-25 ENCOUNTER — Encounter: Payer: Self-pay | Admitting: Family Medicine

## 2023-04-25 VITALS — BP 144/82 | HR 71 | Ht 60.0 in | Wt 152.0 lb

## 2023-04-25 DIAGNOSIS — R1012 Left upper quadrant pain: Secondary | ICD-10-CM | POA: Insufficient documentation

## 2023-04-25 DIAGNOSIS — I10 Essential (primary) hypertension: Secondary | ICD-10-CM | POA: Diagnosis not present

## 2023-04-25 DIAGNOSIS — E7800 Pure hypercholesterolemia, unspecified: Secondary | ICD-10-CM | POA: Diagnosis not present

## 2023-04-25 DIAGNOSIS — R4189 Other symptoms and signs involving cognitive functions and awareness: Secondary | ICD-10-CM

## 2023-04-25 NOTE — Assessment & Plan Note (Signed)
She does still have some TTP in LUQ, small amount of bruising. US abdomen ordered.  Update labs today.

## 2023-04-25 NOTE — Assessment & Plan Note (Signed)
Stable.  Neurology referral placed previously but has not made appt.  Re-entering referral.

## 2023-04-25 NOTE — Assessment & Plan Note (Signed)
BP elevated today, has not taken amlodipine yet today.  Readings at home are well controlled. Continue amlodipine at current strength.

## 2023-04-25 NOTE — Progress Notes (Signed)
Amber Reynolds - 77 y.o. female MRN [id number]  Date of birth: [birth year]  Subjective Chief Complaint  Patient presents with   Hypertension   LUQ Pain    HPI  Amber Reynolds is a 77 y.o. female here today for follow up visit.   She continues on amlodipine 10mg  daily for management of HTN.  Initial BP is elevated.  She does monitor her BP at home.  Readings at home are pretty well controlled most of the time. She denies chest pain, headache, shortness of breath, or vision changes.    She also had bruised area on L side a couple of weeks ago.  Bruise has nearly resolved. She does not recall any trauma to this area.  She still has quite a bit of in the LUQ.  Bowels are moving normally.  Her appetite is normal.   ROS:  A comprehensive ROS was completed and negative except as noted per HPI  No Known Allergies  Past Medical History:  Diagnosis Date   Arthritis 2021   History of colon polyps 09/20/2018    Past Surgical History:  Procedure Laterality Date   JOINT REPLACEMENT  07/29/2021   Right Hip   TUBAL LIGATION  1978    Social History   Socioeconomic History   Marital status: Married    Spouse name: [name]   Number of children: 3   Years of education: 15   Highest education level: Some college, no degree  Occupational History   Occupation: Education officer, 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 Reads a lot. Watches TV.    Social Determinants of Health   Financial Resource Strain: Low Risk  (09/27/2022)   Overall Financial Resource Strain (CARDIA)    Difficulty of Paying Living Expenses: Not hard at all  Food Insecurity: No Food Insecurity (09/27/2022)    Hunger Vital Sign    Worried About Running Out of Food in the Last Year: Never true    Ran Out of Food in the Last Year: Never true  Transportation Needs: No Transportation Needs (09/27/2022)   PRAPARE - Administrator, Civil Service (Medical): No    Lack of Transportation (Non-Medical): No  Physical Activity: Insufficiently Active (09/27/2022)   Exercise Vital Sign    Days of Exercise per Week: 3 days    Minutes of Exercise per Session: 30 min  Stress: No Stress Concern Present (09/27/2022)   [employer] of Occupational Health - Occupational Stress Questionnaire    Feeling of Stress : Not at all  Social Connections: Moderately Integrated (10/01/2022)   Social Connection and Isolation Panel [NHANES]    Frequency of Communication with Friends and Family: Twice a week    Frequency of Social Gatherings with Friends and Family: Once a week    Attends Religious Services: More than 4 times per year    Active Member of [employer] or Organizations: No    Attends Banker Meetings: Never    Marital Status: Married    Family History  Problem Relation Age of Onset   Heart attack Mother    Diabetes Son  Health Maintenance  Topic Date Due   COVID-19 Vaccine (7 - 2023-24 season) 06/25/2023 (Originally 07/28/2022)   INFLUENZA VACCINE  12/12/2023 (Originally 04/14/2023)   Medicare Annual Wellness (AWV)  10/02/2023   DTaP/Tdap/Td (2 - Td or Tdap) 07/08/2031   Pneumonia Vaccine 23+ Years old  Completed   DEXA SCAN  Completed   Hepatitis C Screening  Completed   Zoster Vaccines- Shingrix  Completed   HPV VACCINES  Aged Out   Colonoscopy  Discontinued     ----------------------------------------------------------------------------------------------------------------------------------------------------------------------------------------------------------------- Physical Exam BP (!) 144/82 (BP Location: Left Arm, Patient Position: Sitting, Cuff Size: Normal)   Pulse  71   Ht 5' (1.524 m)   Wt 152 lb (68.9 kg)   SpO2 97%   BMI 29.69 kg/m   Physical Exam Constitutional:      Appearance: Normal appearance.  Eyes:     General: No scleral icterus. Cardiovascular:     Rate and Rhythm: Normal rate and regular rhythm.  Pulmonary:     Effort: Pulmonary effort is normal.     Breath sounds: Normal breath sounds.  Abdominal:     General: There is no distension.     Palpations: Abdomen is soft.     Tenderness: There is abdominal tenderness (TTP LUQ.  Small amount of bruising noted.).  Musculoskeletal:     Cervical back: Neck supple.  Neurological:     Mental Status: She is alert.  Psychiatric:        Mood and Affect: Mood normal.     ------------------------------------------------------------------------------------------------------------------------------------------------------------------------------------------------------------------- Assessment and Plan  LUQ pain She does still have some TTP in LUQ, small amount of bruising. US abdomen ordered.  Update labs today.     Cognitive change Stable.  Neurology referral placed previously but has not made appt.  Re-entering referral.   Essential hypertension BP elevated today, has not taken amlodipine yet today.  Readings at home are well controlled. Continue amlodipine at current strength.    No orders of the defined types were placed in this encounter.   No follow-ups on file.    This visit occurred during the SARS-CoV-2 public health emergency.  Safety protocols were in place, including screening questions prior to the visit, additional usage of staff PPE, and extensive cleaning of exam room while observing appropriate contact time as indicated for disinfecting solutions.

## 2023-05-02 ENCOUNTER — Ambulatory Visit (INDEPENDENT_AMBULATORY_CARE_PROVIDER_SITE_OTHER): Payer: Medicare Other

## 2023-05-02 DIAGNOSIS — I10 Essential (primary) hypertension: Secondary | ICD-10-CM | POA: Diagnosis not present

## 2023-05-02 DIAGNOSIS — E7800 Pure hypercholesterolemia, unspecified: Secondary | ICD-10-CM | POA: Diagnosis not present

## 2023-05-02 DIAGNOSIS — R1012 Left upper quadrant pain: Secondary | ICD-10-CM | POA: Diagnosis not present

## 2023-05-02 DIAGNOSIS — K760 Fatty (change of) liver, not elsewhere classified: Secondary | ICD-10-CM | POA: Diagnosis not present

## 2023-05-02 DIAGNOSIS — N281 Cyst of kidney, acquired: Secondary | ICD-10-CM | POA: Diagnosis not present

## 2023-05-02 DIAGNOSIS — K8020 Calculus of gallbladder without cholecystitis without obstruction: Secondary | ICD-10-CM | POA: Diagnosis not present

## 2023-05-03 LAB — CBC WITH DIFFERENTIAL/PLATELET
Lymphs: 26 %
MCHC: 32 g/dL (ref 31.5–35.7)

## 2023-05-03 LAB — CMP14+EGFR: Creatinine, Ser: 0.79 mg/dL (ref 0.57–1.00)

## 2023-05-03 LAB — LIPID PANEL WITH LDL/HDL RATIO: Triglycerides: 136 mg/dL (ref 0–149)

## 2023-05-18 DIAGNOSIS — Z23 Encounter for immunization: Secondary | ICD-10-CM | POA: Diagnosis not present

## 2023-09-01 DIAGNOSIS — H524 Presbyopia: Secondary | ICD-10-CM | POA: Diagnosis not present

## 2023-09-01 DIAGNOSIS — H25813 Combined forms of age-related cataract, bilateral: Secondary | ICD-10-CM | POA: Diagnosis not present

## 2023-09-01 DIAGNOSIS — H5203 Hypermetropia, bilateral: Secondary | ICD-10-CM | POA: Diagnosis not present

## 2023-09-01 DIAGNOSIS — H35033 Hypertensive retinopathy, bilateral: Secondary | ICD-10-CM | POA: Diagnosis not present

## 2023-10-05 ENCOUNTER — Ambulatory Visit (INDEPENDENT_AMBULATORY_CARE_PROVIDER_SITE_OTHER): Payer: Medicare Other | Admitting: Family Medicine

## 2023-10-05 ENCOUNTER — Encounter: Payer: Self-pay | Admitting: Family Medicine

## 2023-10-05 VITALS — BP 148/76 | HR 75 | Temp 98.0°F | Resp 14 | Ht 61.0 in | Wt 151.2 lb

## 2023-10-05 DIAGNOSIS — Z1231 Encounter for screening mammogram for malignant neoplasm of breast: Secondary | ICD-10-CM | POA: Diagnosis not present

## 2023-10-05 DIAGNOSIS — Z780 Asymptomatic menopausal state: Secondary | ICD-10-CM

## 2023-10-05 DIAGNOSIS — Z0000 Encounter for general adult medical examination without abnormal findings: Secondary | ICD-10-CM | POA: Diagnosis not present

## 2023-10-05 HISTORY — DX: Encounter for general adult medical examination without abnormal findings: Z00.00

## 2023-10-05 NOTE — Progress Notes (Addendum)
Subjective:   Amber Reynolds is a 78 y.o. female who presents for Medicare Annual (Subsequent) preventive examination.  Visit Complete: In person  Patient Medicare AWV questionnaire was completed by the patient on 10/01/23; I have confirmed that all information answered by patient is correct and no changes since this date.  Cardiac Risk Factors include: advanced age (>42men, >47 women);hypertension;dyslipidemia     Objective:    Today's Vitals   10/05/23 0942 10/05/23 0950  BP: (!) 159/105 (!) 148/76  Pulse: 75   Resp: 14   Temp: 98 F (36.7 C)   TempSrc: Oral   SpO2: 98%   Weight: 151 lb 3.2 oz (68.6 kg)   Height: 5\' 1"  (1.549 m)   PainSc:  5    Body mass index is 28.57 kg/m.     10/05/2023    9:55 AM 10/01/2022   10:10 AM 10/08/2020    8:11 AM 09/25/2019    7:56 AM  Advanced Directives  Does Patient Have a Medical Advance Directive? Yes Yes Yes Yes  Type of Estate agent of [place];Living will Living will Healthcare Power of [place];Living will Healthcare Power of [place];Living will  Does patient want to make changes to medical advance directive? No - Patient declined No - Patient declined No - Patient declined No - Patient declined  Copy of Healthcare Power of Attorney in Chart?   No - copy requested No - copy requested  Would patient like information on creating a medical advance directive?   No - Patient declined     Current Medications (verified) Outpatient Encounter Medications as of 10/05/2023  Medication Sig   amLODipine (NORVASC) 10 MG tablet Take 10mg  po daily for blood pressure   Multiple Vitamin (MULTIVITAMIN) tablet Take 1 tablet by mouth daily.   Probiotic Product (PROBIOTIC-10 ULTIMATE PO) Take by mouth.   No facility-administered encounter medications on file as of 10/05/2023.    Allergies (verified) Patient has no known allergies.   History: Past Medical History:  Diagnosis Date   Arthritis 2021   History of colon  polyps 09/20/2018   Hypertension    Past Surgical History:  Procedure Laterality Date   JOINT REPLACEMENT  07/29/2021   Right Hip   TUBAL LIGATION  1978   Family History  Problem Relation Age of Onset   Heart attack Mother    Diabetes Son    Social History   Socioeconomic History   Marital status: Married    Spouse name: [name]   Number of children: 3   Years of education: 15   Highest education level: Some college, no degree  Occupational History   Occupation: Education officer, 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 Reads a lot. Watches TV.    Social Drivers of Corporate investment banker Strain: Low Risk  (10/05/2023)   Overall Financial Resource Strain (CARDIA)    Difficulty of Paying Living Expenses: Not hard at all  Food Insecurity: No Food Insecurity (10/05/2023)   Hunger Vital Sign    Worried About Running Out of Food in the Last Year: Never true  Ran Out of Food in the Last Year: Never true  Transportation Needs: No Transportation Needs (10/04/2023)   PRAPARE - Administrator, Civil Service (Medical): No    Lack of Transportation (Non-Medical): No  Physical Activity: Insufficiently Active (10/05/2023)   Exercise Vital Sign    Days of Exercise per Week: 3 days    Minutes of Exercise per Session: 30 min  Stress: No Stress Concern Present (10/05/2023)   [employer] of Occupational Health - Occupational Stress Questionnaire    Feeling of Stress : Not at all  Social Connections: Socially Isolated (10/05/2023)   Social Connection and Isolation Panel [NHANES]    Frequency of Communication with Friends and Family: Once a week    Frequency  of Social Gatherings with Friends and Family: Once a week    Attends Religious Services: Never    Database administrator or Organizations: No    Attends Engineer, structural: Never    Marital Status: Married    Tobacco Counseling Counseling given: Not Answered   Clinical Intake:  Pre-visit preparation completed: No  Pain : 0-10 Pain Score: 5  Pain Type: Chronic pain Pain Location: Wrist Pain Orientation: Left Pain Descriptors / Indicators: Constant Pain Onset: 1 to 4 weeks ago Pain Frequency: Constant Pain Relieving Factors: heating pad Effect of Pain on Daily Activities: does not affect activiites  Pain Relieving Factors: heating pad  BMI - recorded: 28.6 Nutritional Status: BMI 25 -29 Overweight Nutritional Risks: None Diabetes: No  How often do you need to have someone help you when you read instructions, pamphlets, or other written materials from your doctor or pharmacy?: 1 - Never What is the last grade level you completed in school?: 14  Interpreter Needed?: No      Activities of Daily Living    10/05/2023    9:51 AM 10/01/2023    9:25 AM  In your present state of health, do you have any difficulty performing the following activities:  Hearing? 1 0  Comment uses hearing aid. hears well with aid.   Vision? 0 0  Difficulty concentrating or making decisions? 0 0  Walking or climbing stairs? 0 0  Dressing or bathing? 0 0  Doing errands, shopping? 0 0  Preparing Food and eating ? N N  Using the Toilet? N N  In the past six months, have you accidently leaked urine? N N  Do you have problems with loss of bowel control? N N  Managing your Medications? N N  Managing your Finances? N N  Housekeeping or managing your Housekeeping? N N    Patient Care Team: Everrett Coombe, DO as PCP - General (Family Medicine)  Indicate any recent Medical Services you may have received from other than [facility] providers in the past year (date may be approximate).      Assessment:   This is a routine wellness examination for Amber Reynolds.  Hearing/Vision screen Hearing Screening - Comments:: Wears hearing aid that works well.  Vision Screening - Comments:: Wears glasses, no issues with vision.    Goals Addressed             This Visit's Progress    DIET - INCREASE WATER INTAKE       Goal: 64 ounces per day      Depression Screen    10/05/2023    9:55 AM 10/25/2022    8:39 AM 10/01/2022   10:14 AM 04/23/2022    8:17 AM 10/23/2021  9:13 AM 10/08/2020    8:15 AM 09/25/2019    8:00 AM  PHQ 2/9 Scores  PHQ - 2 Score 0 0 0 0 0 0 0  PHQ- 9 Score      0     Fall Risk    10/05/2023    9:56 AM 10/01/2023    9:25 AM 10/25/2022    8:39 AM 10/01/2022   10:14 AM 09/27/2022    1:49 PM  Fall Risk   Falls in the past year? 0 0 0 0 0  Number falls in past yr: 0 0 0 0 0  Injury with Fall? 0 0 0 0   Risk for fall due to : No Fall Risks  No Fall Risks No Fall Risks   Follow up Falls evaluation completed  Falls evaluation completed Falls evaluation completed     MEDICARE RISK AT HOME: Medicare Risk at Home Any stairs in or around the home?: Yes If so, are there any without handrails?: No Home free of loose throw rugs in walkways, pet beds, electrical cords, etc?: Yes Adequate lighting in your home to reduce risk of falls?: Yes Life alert?: No Use of a cane, walker or w/c?: No Grab bars in the bathroom?: No Shower chair or bench in shower?: Yes Elevated toilet seat or a handicapped toilet?: No  TIMED UP AND GO:  Was the test performed?  Yes  Length of time to ambulate 10 feet: 13 sec Gait steady and fast without use of assistive device    Cognitive Function:        10/05/2023    9:58 AM 10/05/2023    9:56 AM 10/01/2022   10:17 AM 10/08/2020    8:20 AM 09/25/2019    8:02 AM  6CIT Screen  What Year?  4 points 4 points 0 points 0 points  What month?  3 points 3 points 0 points 0 points  What time?  0 points 0 points 0 points 0 points  Count back  from 20  0 points 2 points 0 points 0 points  Months in reverse  4 points 2 points 0 points 4 points  Repeat phrase -- 10 points 10 points 10 points 6 points  Total Score  21 points 21 points 10 points 10 points    Immunizations Immunization History  Administered Date(s) Administered   Fluad Quad(high Dose 65+) 06/06/2020   Influenza, High Dose Seasonal PF 06/23/2021   Influenza-Unspecified 08/27/2019, 06/23/2021, 05/19/2022   PFIZER(Purple Top)SARS-COV-2 Vaccination 10/15/2019, 11/13/2019, 07/16/2020, 01/29/2021   Pfizer Covid-19 Vaccine Bivalent Booster 4yrs & up 09/21/2021, 06/02/2022   Pneumococcal Conjugate-13 10/21/2020   Pneumococcal Polysaccharide-23 09/25/2019   Tdap 07/07/2021   Zoster Recombinant(Shingrix) 05/19/2022, 09/20/2022    TDAP status: Up to date  Flu Vaccine status: Up to date  Pneumococcal vaccine status: Up to date  Covid-19 vaccine status: Information provided on how to obtain vaccines.   Qualifies for Shingles Vaccine? Yes   Zostavax completed No   Shingrix Completed?: Yes  Screening Tests Health Maintenance  Topic Date Due   COVID-19 Vaccine (7 - 2024-25 season) 05/15/2023   Medicare Annual Wellness (AWV)  10/04/2024   DTaP/Tdap/Td (2 - Td or Tdap) 07/08/2031   Pneumonia Vaccine 14+ Years old  Completed   INFLUENZA VACCINE  Completed   DEXA SCAN  Completed   Hepatitis C Screening  Completed   Zoster Vaccines- Shingrix  Completed   HPV VACCINES  Aged Out    Health Maintenance  Health Maintenance Due  Topic Date Due   COVID-19 Vaccine (7 - 2024-25 season) 05/15/2023    Colorectal cancer screening: No longer required.   Mammogram status: Ordered 10/05/2023. Pt provided with contact info and advised to call to schedule appt.   Bone Density status: Completed 10/15/2020.Marland Kitchen Results reflect: Bone density results: OSTEOPOROSIS. Repeat every 2 years.  Lung Cancer Screening: (Low Dose CT Chest recommended if Age 25-80 years, 20 pack-year  currently smoking OR have quit w/in 15years.) does not qualify.   Lung Cancer Screening Referral: n/a   Additional Screening:  Hepatitis C Screening: does qualify; Completed 10/06/2019  Vision Screening: Recommended annual ophthalmology exams for early detection of glaucoma and other disorders of the eye. Is the patient up to date with their annual eye exam?  Yes  Who is the provider or what is the name of the office in which the patient attends annual eye exams? [facility], Dr. Tiburcio Pea If pt is not established with a provider, would they like to be referred to a provider to establish care? No .   Dental Screening: Recommended annual dental exams for proper oral hygiene  Diabetic Foot Exam: n/a   Community Resource Referral / Chronic Care Management: CRR required this visit?  No   CCM required this visit?  No     Plan:     I have personally reviewed and noted the following in the patient's chart:   Medical and social history Use of alcohol, tobacco or illicit drugs  Current medications and supplements including opioid prescriptions. Patient is not currently taking opioid prescriptions. Functional ability and status Nutritional status Physical activity Advanced directives List of other physicians Hospitalizations, surgeries, and ER visits in previous 12 months: no  Vitals Screenings to include cognitive, depression, and falls Referrals and appointments  In addition, I have reviewed and discussed with patient certain preventive protocols, quality metrics, and best practice recommendations. A written personalized care plan for preventive services as well as general preventive health recommendations were provided to patient.     Novella Olive, FNP   10/05/2023   After Visit Summary: (Declined) Due to this being a telephonic visit, with patients personalized plan was offered to patient but patient Declined AVS at this time   Follow-up with PCP as scheduled. Blood  pressure elevated at this visit. Has appointment with neuro on 10/24/23 for memory issues.  Referrals: Mammogram and bone density.  Discuss with PCP concerning getting Colonoscopy.  Covid vaccine at local pharmacy.

## 2023-10-24 ENCOUNTER — Ambulatory Visit: Payer: Medicare Other | Admitting: Neurology

## 2023-10-24 ENCOUNTER — Telehealth: Payer: Self-pay | Admitting: Neurology

## 2023-10-24 NOTE — Telephone Encounter (Signed)
 LVM and sent mychart msg informing pt of need to reschedule 10/24/23 appt - MD out

## 2023-10-26 ENCOUNTER — Ambulatory Visit (INDEPENDENT_AMBULATORY_CARE_PROVIDER_SITE_OTHER): Payer: Medicare Other | Admitting: Family Medicine

## 2023-10-26 ENCOUNTER — Encounter: Payer: Self-pay | Admitting: Family Medicine

## 2023-10-26 VITALS — BP 162/76 | HR 68 | Ht 61.0 in | Wt 151.0 lb

## 2023-10-26 DIAGNOSIS — I10 Essential (primary) hypertension: Secondary | ICD-10-CM | POA: Diagnosis not present

## 2023-10-26 DIAGNOSIS — M1812 Unilateral primary osteoarthritis of first carpometacarpal joint, left hand: Secondary | ICD-10-CM | POA: Diagnosis not present

## 2023-10-26 NOTE — Assessment & Plan Note (Signed)
BP elevated today.  Monitor BP at home and send readings after a few weeks.  Can consider addition of hydrochlorothiazide if bp remains elevated.

## 2023-10-26 NOTE — Progress Notes (Signed)
Amber Reynolds - 78 y.o. female MRN [id number]  Date of birth: 08-02-1946  Subjective Chief Complaint  Patient presents with   Joint Pain   Hypertension    HPI Amber Reynolds is a 78 y.o. female here today for follow up visit.   She is having pain in the base of the left thumb.  She has tried. Ice, heat and voltaren gel without much improvement.  Denies swelling.    Remains on amlodipine for management of BP.  BP is elevated today.  She is not monitoring at home. She denies symptoms related to HTN including chest pain, shortness of breath, palpitations, headache or vision changes.   ROS:  A comprehensive ROS was completed and negative except as noted per HPI  No Known Allergies  Past Medical History:  Diagnosis Date   Arthritis 2021   Encounter for Medicare annual wellness exam 10/05/2023   History of colon polyps 09/20/2018   Hypertension     Past Surgical History:  Procedure Laterality Date   JOINT REPLACEMENT  07/29/2021   Right Hip   TUBAL LIGATION  1978    Social History   Socioeconomic History   Marital status: Married    Spouse name: [name]   Number of children: 3   Years of education: 15   Highest education level: Some college, no degree  Occupational History   Occupation: Education officer, 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 Reads a lot. Watches TV.    Social Drivers of Corporate investment banker Strain: Low Risk  (10/05/2023)   Overall Financial Resource Strain (CARDIA)    Difficulty of Paying Living Expenses: Not hard at all  Food Insecurity: No Food Insecurity (10/05/2023)   Hunger Vital Sign    Worried About Running Out of Food  in the Last Year: Never true    Ran Out of Food in the Last Year: Never true  Transportation Needs: No Transportation Needs (10/04/2023)   PRAPARE - Administrator, Civil Service (Medical): No    Lack of Transportation (Non-Medical): No  Physical Activity: Insufficiently Active (10/05/2023)   Exercise Vital Sign    Days of Exercise per Week: 3 days    Minutes of Exercise per Session: 30 min  Stress: No Stress Concern Present (10/05/2023)   [employer] of Occupational Health - Occupational Stress Questionnaire    Feeling of Stress : Not at all  Social Connections: Socially Isolated (10/05/2023)   Social Connection and Isolation Panel [NHANES]    Frequency of Communication with Friends and Family: Once a week    Frequency of Social Gatherings with Friends and Family: Once a week    Attends Religious Services: Never    Database administrator or Organizations: No    Attends Banker Meetings: Never    Marital Status: Married    Family History  Problem Relation Age of Onset   Heart attack Mother    Diabetes Son     Health Maintenance  Topic Date Due   COVID-19 Vaccine (8 - 2024-25 season) 07/13/2023   Medicare Annual Wellness (AWV)  10/04/2024   DTaP/Tdap/Td (2 - Td or Tdap) 07/08/2031   Pneumonia Vaccine 53+ Years old  Completed   INFLUENZA VACCINE  Completed   DEXA SCAN  Completed   Hepatitis C Screening  Completed   Zoster Vaccines- Shingrix  Completed   HPV VACCINES  Aged Out     ----------------------------------------------------------------------------------------------------------------------------------------------------------------------------------------------------------------- Physical Exam BP (!) 162/76 (BP Location: Left Arm, Patient Position: Sitting, Cuff Size: Normal)   Pulse 68   Ht 5\' 1"  (1.549 m)   Wt 151 lb (68.5 kg)   SpO2 98%   BMI 28.53 kg/m   Physical Exam Constitutional:      Appearance: Normal appearance.  HENT:      Head: Normocephalic and atraumatic.  Eyes:     General: No scleral icterus. Cardiovascular:     Rate and Rhythm: Normal rate and regular rhythm.  Pulmonary:     Effort: Pulmonary effort is normal.     Breath sounds: Normal breath sounds.  Neurological:     General: No focal deficit present.     Mental Status: She is alert.  Psychiatric:        Mood and Affect: Mood normal.        Behavior: Behavior normal.     ------------------------------------------------------------------------------------------------------------------------------------------------------------------------------------------------------------------- Assessment and Plan  Essential hypertension BP elevated today.  Monitor BP at home and send readings after a few weeks.  Can consider addition of hydrochlorothiazide if bp remains elevated.   Osteoarthritis of thumb, left She does not want to have an injection at this time.  I prescribed a thumb spica brace to wear for a couple of weeks then as needed for flares.     No orders of the defined types were placed in this encounter.   Return in about 6 months (around 04/24/2024) for Hypertension.    This visit occurred during the SARS-CoV-2 public health emergency.  Safety protocols were in place, including screening questions prior to the visit, additional usage of staff PPE, and extensive cleaning of exam room while observing appropriate contact time as indicated for disinfecting solutions.

## 2023-10-26 NOTE — Assessment & Plan Note (Signed)
She does not want to have an injection at this time.  I prescribed a thumb spica brace to wear for a couple of weeks then as needed for flares.

## 2023-10-26 NOTE — Patient Instructions (Signed)
Let me know what BP looks like over the next couple of weeks.

## 2023-10-27 ENCOUNTER — Other Ambulatory Visit: Payer: Self-pay | Admitting: Family Medicine

## 2023-11-16 ENCOUNTER — Ambulatory Visit (INDEPENDENT_AMBULATORY_CARE_PROVIDER_SITE_OTHER): Payer: Medicare Other | Admitting: Neurology

## 2023-11-16 ENCOUNTER — Encounter: Payer: Self-pay | Admitting: Neurology

## 2023-11-16 VITALS — BP 147/71 | HR 78 | Ht 61.0 in | Wt 152.0 lb

## 2023-11-16 DIAGNOSIS — F04 Amnestic disorder due to known physiological condition: Secondary | ICD-10-CM

## 2023-11-16 DIAGNOSIS — R419 Unspecified symptoms and signs involving cognitive functions and awareness: Secondary | ICD-10-CM | POA: Diagnosis not present

## 2023-11-16 NOTE — Patient Instructions (Signed)
 Management of Memory Problems  There are some general things you can do to help manage your memory problems.  Your memory may not in fact recover, but by using techniques and strategies you will be able to manage your memory difficulties better.  1)  Establish a routine. Try to establish and then stick to a regular routine.  By doing this, you will get used to what to expect and you will reduce the need to rely on your memory.  Also, try to do things at the same time of day, such as taking your medication or checking your calendar first thing in the morning. Think about think that you can do as a part of a regular routine and make a list.  Then enter them into a daily planner to remind you.  This will help you establish a routine.  2)  Organize your environment. Organize your environment so that it is uncluttered.  Decrease visual stimulation.  Place everyday items such as keys or cell phone in the same place every day (ie.  Basket next to front door) Use post it notes with a brief message to yourself (ie. Turn off light, lock the door) Use labels to indicate where things go (ie. Which cupboards are for food, dishes, etc.) Keep a notepad and pen by the telephone to take messages  3)  Memory Aids A diary or journal/notebook/daily planner Making a list (shopping list, chore list, to do list that needs to be done) Using an alarm as a reminder (kitchen timer or cell phone alarm) Using cell phone to store information (Notes, Calendar, Reminders) Calendar/White board placed in a prominent position Post-it notes  In order for memory aids to be useful, you need to have good habits.  It's no good remembering to make a note in your journal if you don't remember to look in it.  Try setting aside a certain time of day to look in journal.  4)  Improving mood and managing fatigue. There may be other factors that contribute to memory difficulties.  Factors, such as anxiety, depression and tiredness can  affect memory. Regular gentle exercise can help improve your mood and give you more energy. Simple relaxation techniques may help relieve symptoms of anxiety Try to get back to completing activities or hobbies you enjoyed doing in the past. Learn to pace yourself through activities to decrease fatigue. Find out about some local support groups where you can share experiences with others. Try and achieve 7-8 hours of sleep at night.   Dementia Caregiver Guide Dementia is a condition that affects the way the brain works. It often affects thinking and memory. A person with dementia may: Forget things. Have trouble talking or responding to your questions. Have trouble paying attention. Have trouble thinking clearly and making good decisions. Get lost or wander away from home or other places. Have big changes in their mood or emotions. They may: Feel very worried, nervous, or depressed. Have angry outbursts. Be suspicious or accuse you of things. Have childlike behavior and language. Taking care of someone with dementia can be a challenge. The tips below can help you care for the person. How to help manage lifestyle changes Dementia usually gets worse slowly over time. In the early stages, people with dementia can stay safe and take care of themselves with some help. In later stages, they need help with daily tasks like getting dressed, grooming, and going to the bathroom. Communicating When the person is talking and seems frustrated, make  eye contact and hold the person's hand. Ask questions that can be answered with a yes or no. Use simple words and a calm voice. Only give one direction at a time. Limit choices for the person. Too many choices can be stressful. Avoid correcting the person in a negative way. If the person can't find the right words, gently try to help. Preventing injury  Keep floors clear. Remove rugs, magazine racks, and floor lamps. Keep hallways well lit, especially  at night. Put a handrail and nonslip mat in the bathtub or shower. Put childproof locks on cabinets that have dangerous items in them. These items include medicine, alcohol, guns, cleaning products, and sharp tools. For doors to the outside, put locks where the person can't see or reach them. This helps keep the person from going out of the house and getting lost. Be ready for emergencies. Keep a list of emergency phone numbers and addresses close by. Remove car keys and lock garage doors so the person doesn't try to drive. Have the person wear a bracelet that tracks where they are and shows that they're a person with memory loss. This should be worn at all times for safety. Helping with daily life  Keep the person on track with their daily routine. Try to identify areas where the person may need help. Be supportive, patient, calm, and encouraging. Gently remind the person that adjusting to changes takes time. Help with the tasks that the person has asked for help with. Keep the person involved in daily tasks and decisions as much as you can. Encourage conversation, but try not to get frustrated if the person struggles to find words or doesn't seem to appreciate your help. Other tips Think about any safety risks and take steps to avoid them. Keep things organized: Organize medicines in a pill box for each day of the week. Keep a calendar in a central place. Use it to remind the person of health care visits or other activities. Create a plan to handle any legal or financial matters. Get help from a professional if needed. Help make sure the person: Takes medicines only as told by their health care providers. Eats regular, healthy meals. They should also drink plenty of fluids. Goes to all scheduled health care appointments. Gets regular sleep. Taking care of yourself Being a caregiver for someone with dementia can be hard. You may feel stressed and have many other emotions. It's important  to also take care of yourself. Here are some tips: Find out about services that can provide short-term care for the person. This is called respite care. It can allow you to take a break when you need one. Find healthy ways to deal with stress. Some ways include: Spending time with other people. Exercising. Meditating or doing deep breathing exercises. Take care of your own health by: Getting enough sleep. Eating healthy foods. Getting regular exercise. Join a support group with others who are caregivers. These groups can help you: Learn other ways to deal with stress. Share experiences with others. Get emotional comfort and support. Learn about caregiving as the disease gets worse. Find resources in your community. Where to find support: Many people and organizations offer support. These include: Support groups for people with dementia. Support groups for caregivers. Counselors or therapists. Home health care services. Adult day care centers. Where to find more information Alzheimer's Association: WesternTunes.it Family Caregiver Alliance: caregiver.org Alzheimer's Foundation of [place]: alzfdn.org Contact a health care provider if: The person's health is quickly  getting worse. You're no longer able to care for the person. Caring for the person is affecting your physical and emotional health. You're feeling worried, nervous, or depressed about caring for the person. Get help right away if: You feel like the person may hurt themselves or others. The person has talked about taking their own life. These symptoms may be an emergency. Take one of these steps right away: Go to your nearest emergency room. Call 911. Call the National Suicide Prevention Lifeline at (959) 778-9017 or 988. Text the Crisis Text Line at 740-493-7654. This information is not intended to replace advice given to you by your health care provider. Make sure you discuss any questions you have with your health care  provider. Document Revised: 12/10/2022 Document Reviewed: 12/10/2022 Elsevier Patient Education  2024 [employer].

## 2023-11-16 NOTE — Progress Notes (Signed)
 SLEEP MED[facility]INE CLINIC    Provider:  Melvyn Novas, MD  Primary Care Physician:  Everrett Coombe, DO [facility] [address] [place] [address]     Referring Provider: Everrett Coombe, Do [address] [place],  [place] [address]          Chief Complaint according to patient   Patient presents with:     New Patient (Initial Visit)           HISTORY OF PRESENT ILLNESS:  Amber Reynolds is a 78 y.o. female patient who is seen upon PCP  referral on 11/16/2023  for a Memory evaluation.  .  Chief concern according to patient :  " medicare wellness exam was abnormal "    I have the pleasure of seeing Amber Reynolds 11/16/23 a left-handed female with memory problems.   Medical history, 2022 hip surgery,  no complication. No heart disease. HTN , no DM, no thyroid disease, no anemia.  Healthy until 2022,  no medications at that point.  No TBI.  Hearing impaired on hearing aids.      Family medical history: father died young in a mine accident - , mother had  cancer died at age 27,   Twin sister and brother are healthy.   Social history: HS graduate and business college, 2 years,   Patient is retired from [employer]  in 2004, and lives in a household with spouse,   adult children, and grandchildren. No pets,   Tobacco use; none .   ETOH use : none,  Caffeine intake in form of Coffee( 1-2 cups ) Soda( /) Tea ( /) or energy drinks Exercise in form of  walking .   Hobbies : reading, used to play tennis.      Daily routines l gets up at 7 Am and to bed at 10 PM, cleans the house, yard work, Sales executive.  Has not been driving for 9 months after getting lost. Well rested, no need for naps.    Amber Reynolds is a 78 y.o. female here today for follow up visit.    She had medicare AWV recently and was noted to have elevated 6-CIT.  She has not noted significant cognitive changes.  Her husband accompanies her today and  reports that he has noted some mild short term memory change but nothing too significant..  There is no family history of dementia.  No prior history of thyroid disorder or known vitamin deficiency.    She has increased her amlodipine to 10mg  daily.  She is tolerating this well.  She brings in home BP cuff today.  Readings at home have been well controlled. Denies symptoms related to HTN including chest pain, shortness of breath, palpitations, headache or vision changes.    Review of Systems: Out of a complete 14 system review, the patient complains of only the following symptoms, and all other reviewed systems are negative.:      11/16/2023    3:46 PM  MMSE - Mini Mental State Exam  Orientation to time 2  Orientation to Place 3  Registration 3  Attention/ Calculation 0  Recall 1  Language- name 2 objects 2  Language- repeat 0  Language- follow 3 step command 3  Language- read & follow direction 1  Write a sentence 1  Copy design 0  Total score 16      Social History  Socioeconomic History   Marital status: Married    Spouse name: [name]   Number of children: 3   Years of education: 15   Highest education level: Some college, no degree  Occupational History   Occupation: Education officer, 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 Reads a lot. Watches TV.    Pt lives with husband    Retired    Teacher, early years/pre Strain: Low Risk  (10/05/2023)   Overall Financial Resource Strain (CARDIA)    Difficulty of Paying Living Expenses: Not hard at all  Food Insecurity: No Food Insecurity (10/05/2023)   Hunger Vital Sign    Worried About  Running Out of Food in the Last Year: Never true    Ran Out of Food in the Last Year: Never true  Transportation Needs: No Transportation Needs (10/04/2023)   PRAPARE - Administrator, Civil Service (Medical): No    Lack of Transportation (Non-Medical): No  Physical Activity: Insufficiently Active (10/05/2023)   Exercise Vital Sign    Days of Exercise per Week: 3 days    Minutes of Exercise per Session: 30 min  Stress: No Stress Concern Present (10/05/2023)   [employer] of Occupational Health - Occupational Stress Questionnaire    Feeling of Stress : Not at all  Social Connections: Socially Isolated (10/05/2023)   Social Connection and Isolation Panel [NHANES]    Frequency of Communication with Friends and Family: Once a week    Frequency of Social Gatherings with Friends and Family: Once a week    Attends Religious Services: Never    Database administrator or Organizations: No    Attends Engineer, structural: Never    Marital Status: Married    Family History  Problem Relation Age of Onset   Heart attack Mother    Diabetes Son    Alzheimer's disease Neg Hx    Dementia Neg Hx     Past Medical History:  Diagnosis Date   Arthritis 2021   Encounter for Medicare annual wellness exam 10/05/2023   History of colon polyps 09/20/2018   Hypertension     Past Surgical History:  Procedure Laterality Date   JOINT REPLACEMENT  07/29/2021   Right Hip   TUBAL LIGATION  1978     Current Outpatient Medications on File Prior to Visit  Medication Sig Dispense Refill   amLODipine (NORVASC) 10 MG tablet TAKE 1 TABLET BY MOUTH EVERY DAY FOR BLOOD PRESSURE 90 tablet 3   Multiple Vitamin (MULTIVITAMIN) tablet Take 1 tablet by mouth daily.     Probiotic Product (PROBIOTIC-10 ULTIMATE PO) Take by mouth.     No current facility-administered medications on file prior to visit.    No Known Allergies   DIAGNOSTIC DATA (LABS, IMAGING, TESTING) - I reviewed patient  records, labs, notes, testing and imaging myself where available.  Lab Results  Component Value Date   WBC 6.6 05/02/2023   HGB 14.4 05/02/2023   HCT 45.0 05/02/2023   MCV 89 05/02/2023   PLT 291 05/02/2023      Component  Value Date/Time   NA 146 (H) 05/02/2023 0850   K 4.4 05/02/2023 0850   CL 108 (H) 05/02/2023 0850   CO2 21 05/02/2023 0850   GLUCOSE 87 05/02/2023 0850   GLUCOSE 84 04/23/2022 0831   BUN 12 05/02/2023 0850   CREATININE 0.79 05/02/2023 0850   CREATININE 0.69 04/23/2022 0831   CALCIUM 9.4 05/02/2023 0850   PROT 6.4 05/02/2023 0850   ALBUMIN 4.2 05/02/2023 0850   AST 26 05/02/2023 0850   ALT 24 05/02/2023 0850   ALKPHOS 82 05/02/2023 0850   BILITOT 0.5 05/02/2023 0850   GFRNONAA 90 10/21/2020 0000   GFRAA 104 10/21/2020 0000   Lab Results  Component Value Date   CHOL 251 (H) 05/02/2023   HDL 50 05/02/2023   LDLCALC 176 (H) 05/02/2023   TRIG 136 05/02/2023   CHOLHDL 4.4 04/23/2022   Lab Results  Component Value Date   HGBA1C 5.1 07/02/2021   Lab Results  Component Value Date   VITAMINB12 371 10/25/2022   Lab Results  Component Value Date   TSH 1.49 10/25/2022    PHYSICAL EXAM:  Today's Vitals   11/16/23 1540  BP: (!) 147/71  Pulse: 78  Weight: 152 lb (68.9 kg)  Height: 5\' 1"  (1.549 m)   Body mass index is 28.72 kg/m.   Wt Readings from Last 3 Encounters:  11/16/23 152 lb (68.9 kg)  10/26/23 151 lb (68.5 kg)  10/05/23 151 lb 3.2 oz (68.6 kg)     Ht Readings from Last 3 Encounters:  11/16/23 5\' 1"  (1.549 m)  10/26/23 5\' 1"  (1.549 m)  10/05/23 5\' 1"  (1.549 m)      General: The patient is awake, alert and appears not in acute distress. The patient is well groomed. Head: Normocephalic, atraumatic. Neck is supple.  Cardiovascular:  Regular rate and cardiac rhythm by pulse,  without distended neck veins. Respiratory: Lungs are clear to auscultation.  Skin:  Without evidence of ankle edema, or rash.  Stiffness in thumb joint.   Trunk: The patient's posture is erect.   NEUROLOGIC EXAM: The patient is awake and alert, oriented to place and time.   Memory subjective described as intact.  Attention span & concentration ability appears normal.  Speech is fluent,  without  dysarthria, dysphonia or aphasia.  Mood and affect are appropriate.   Cranial nerves: no loss of smell or taste reported  Pupils are equal and briskly reactive to light. Funduscopic exam deferred. .  Extraocular movements in vertical and horizontal planes were intact and without nystagmus. No Diplopia. Visual fields by finger perimetry are intact. Hearing was intact to soft voice and finger rubbing.    Facial sensation intact to fine touch.  Facial motor strength is symmetric and tongue and uvula move midline.  Neck ROM : rotation, tilt and flexion extension were normal for age and shoulder shrug was symmetrical.    Motor exam:  Symmetric bulk, tone and ROM.   Normal tone without cog wheeling, symmetric grip strength .   Sensory:  Fine touch, pinprick and vibration were tested  and  normal.  Proprioception tested in the upper extremities was normal.   Coordination: Rapid alternating movements in the fingers/hands were of normal speed.  The Finger-to-nose maneuver was intact without evidence of ataxia, dysmetria or tremor.   Gait and station: Patient could rise unassisted from a seated position, walked without assistive device.  Stance is of normal width/ base and the patient turned with 3 steps.  Toe and  heel walk were deferred.  Deep tendon reflexes: in the  upper and lower extremities are symmetric and intact.  Babinski response was deferred.     ASSESSMENT AND PLAN 78 y.o. year old female  here with:    1) STM loss, amnestic- forgot to close the gas on the stove.   Driving and got lost, on the way to her beauty parlor.  Now repeatedly asking questions about upcoming appointments.  Has trouble with birthdays , remembering names.     2) Low MMSE ,- advanced cognitive disorder.    3) I will order ATN, dementia panel, and MRI brain.  Rueling out vascular contribution.    Keep active and socially,    I plan to follow up either personally or through our NP within 5 months.   I would like to thank Everrett Coombe, DO and Everrett Coombe, Do [address] [place],  [place] [address] for allowing me to meet with and to take care of this pleasant patient.     After spending a total time of  45  minutes face to face and additional time for physical and neurologic examination, review of laboratory studies,  personal review of imaging studies, reports and results of other testing and review of referral information / records as far as provided in visit,   Electronically signed by: Melvyn Novas, MD 11/16/2023 4:12 PM  [facility] Board certified by The [employer] of Sleep Medicine and Diplomate of the [employer] of Sleep Medicine. Board certified In Neurology through the ABPN, Fellow of the [employer] of Neurology.

## 2023-11-21 ENCOUNTER — Telehealth: Payer: Self-pay | Admitting: Neurology

## 2023-11-21 NOTE — Telephone Encounter (Signed)
 no auth required sent to GI [phone]

## 2023-11-23 ENCOUNTER — Telehealth: Payer: Self-pay | Admitting: Neurology

## 2023-11-23 LAB — ATN PROFILE
A -- Beta-amyloid 42/40 Ratio: 0.115 (ref >0.102)
Beta-amyloid 40: 183.82 pg/mL
Beta-amyloid 42: 21.06 pg/mL
N -- NfL, Plasma: 3 pg/mL (ref 0.00–7.64)
T -- p-tau181: 0.72 pg/mL (ref 0.00–0.97)

## 2023-11-23 LAB — APOE ALZHEIMER'S RISK

## 2023-11-23 LAB — METHYLMALONIC ACID, SERUM: Methylmalonic Acid: 141 nmol/L (ref 0–378)

## 2023-11-23 LAB — COMPREHENSIVE METABOLIC PANEL
ALT: 21 IU/L (ref 0–32)
AST: 23 IU/L (ref 0–40)
Albumin: 4.5 g/dL (ref 3.8–4.8)
Alkaline Phosphatase: 101 IU/L (ref 44–121)
BUN/Creatinine Ratio: 19 (ref 12–28)
BUN: 14 mg/dL (ref 8–27)
Bilirubin Total: 0.3 mg/dL (ref 0.0–1.2)
CO2: 19 mmol/L — ABNORMAL LOW (ref 20–29)
Calcium: 9.3 mg/dL (ref 8.7–10.3)
Chloride: 111 mmol/L — ABNORMAL HIGH (ref 96–106)
Creatinine, Ser: 0.75 mg/dL (ref 0.57–1.00)
Globulin, Total: 2.3 g/dL (ref 1.5–4.5)
Glucose: 131 mg/dL — ABNORMAL HIGH (ref 70–99)
Potassium: 4.2 mmol/L (ref 3.5–5.2)
Sodium: 146 mmol/L — ABNORMAL HIGH (ref 134–144)
Total Protein: 6.8 g/dL (ref 6.0–8.5)
eGFR: 81 mL/min/{1.73_m2} (ref >59)

## 2023-11-23 LAB — CBC WITH DIFFERENTIAL/PLATELET
Basophils Absolute: 0.1 10*3/uL (ref 0.0–0.2)
Basos: 1 %
EOS (ABSOLUTE): 0.2 10*3/uL (ref 0.0–0.4)
Eos: 3 %
Hematocrit: 47.4 % — ABNORMAL HIGH (ref 34.0–46.6)
Hemoglobin: 15.7 g/dL (ref 11.1–15.9)
Immature Grans (Abs): 0 10*3/uL (ref 0.0–0.1)
Immature Granulocytes: 0 %
Lymphocytes Absolute: 2.2 10*3/uL (ref 0.7–3.1)
Lymphs: 29 %
MCH: 29.8 pg (ref 26.6–33.0)
MCHC: 33.1 g/dL (ref 31.5–35.7)
MCV: 90 fL (ref 79–97)
Monocytes Absolute: 0.8 10*3/uL (ref 0.1–0.9)
Monocytes: 10 %
Neutrophils Absolute: 4.4 10*3/uL (ref 1.4–7.0)
Neutrophils: 57 %
Platelets: 325 10*3/uL (ref 150–450)
RBC: 5.26 x10E6/uL (ref 3.77–5.28)
RDW: 12.3 % (ref 11.7–15.4)
WBC: 7.6 10*3/uL (ref 3.4–10.8)

## 2023-11-23 LAB — PROTEIN ELECTROPHORESIS, SERUM
A/G Ratio: 1.3 (ref 0.7–1.7)
Albumin ELP: 3.9 g/dL (ref 2.9–4.4)
Alpha 1: 0.2 g/dL (ref 0.0–0.4)
Alpha 2: 0.7 g/dL (ref 0.4–1.0)
Beta: 1.1 g/dL (ref 0.7–1.3)
Gamma Globulin: 0.9 g/dL (ref 0.4–1.8)
Globulin, Total: 2.9 g/dL (ref 2.2–3.9)

## 2023-11-23 LAB — TSH+FREE T4
Free T4: 1.17 ng/dL (ref 0.82–1.77)
TSH: 1.99 u[IU]/mL (ref 0.450–4.500)

## 2023-11-23 LAB — SEDIMENTATION RATE: Sed Rate: 16 mm/h (ref 0–40)

## 2023-11-23 LAB — ANA W/REFLEX: Anti Nuclear Antibody (ANA): NEGATIVE

## 2023-11-23 LAB — RPR: RPR Ser Ql: NONREACTIVE

## 2023-11-23 NOTE — Telephone Encounter (Signed)
-----   Message from Elkhorn Dohmeier sent at 11/23/2023  1:10 PM EDT ----- Hypernatremia,  high hematocrit.  Negative for AD ( alzheimer's diease ) by BIOMARKER PANEL !! No allele of APO E 4 was present, the genetic is excellent   genotyping  resulted in  2APO E 3 .    Everything else is negative : thyroid, vitamin levels,  e phoresis, ANA.

## 2023-11-23 NOTE — Telephone Encounter (Signed)
 Called the pt to review lab work. There was no answer. Left a message pt to call back or review mychart.

## 2023-12-01 ENCOUNTER — Ambulatory Visit
Admission: RE | Admit: 2023-12-01 | Discharge: 2023-12-01 | Disposition: A | Discharge status: Home / Self Care | Source: Ambulatory Visit | Attending: Neurology | Admitting: Neurology

## 2023-12-01 ENCOUNTER — Encounter: Payer: Self-pay | Admitting: Neurology

## 2023-12-01 DIAGNOSIS — F04 Amnestic disorder due to known physiological condition: Secondary | ICD-10-CM

## 2023-12-01 DIAGNOSIS — R419 Unspecified symptoms and signs involving cognitive functions and awareness: Secondary | ICD-10-CM

## 2023-12-21 ENCOUNTER — Ambulatory Visit: Payer: Medicare Other

## 2023-12-21 DIAGNOSIS — M8589 Other specified disorders of bone density and structure, multiple sites: Secondary | ICD-10-CM | POA: Diagnosis not present

## 2023-12-21 DIAGNOSIS — Z1231 Encounter for screening mammogram for malignant neoplasm of breast: Secondary | ICD-10-CM | POA: Diagnosis not present

## 2023-12-21 DIAGNOSIS — Z13820 Encounter for screening for osteoporosis: Secondary | ICD-10-CM

## 2023-12-21 DIAGNOSIS — Z780 Asymptomatic menopausal state: Secondary | ICD-10-CM | POA: Diagnosis not present

## 2023-12-23 ENCOUNTER — Encounter: Payer: Self-pay | Admitting: Family Medicine

## 2024-01-18 ENCOUNTER — Encounter: Payer: Self-pay | Admitting: Family Medicine

## 2024-02-23 ENCOUNTER — Telehealth: Payer: Self-pay | Admitting: Adult Health

## 2024-02-23 NOTE — Telephone Encounter (Signed)
 LVM and sent mychart msg informing pt of need to reschedule 04/30/24 appt - NP out

## 2024-04-24 ENCOUNTER — Encounter: Payer: Self-pay | Admitting: Family Medicine

## 2024-04-24 ENCOUNTER — Ambulatory Visit (INDEPENDENT_AMBULATORY_CARE_PROVIDER_SITE_OTHER): Payer: Medicare Other | Admitting: Family Medicine

## 2024-04-24 VITALS — BP 146/79 | HR 72 | Resp 20 | Ht 61.0 in | Wt 151.0 lb

## 2024-04-24 DIAGNOSIS — R7309 Other abnormal glucose: Secondary | ICD-10-CM | POA: Diagnosis not present

## 2024-04-24 DIAGNOSIS — R1012 Left upper quadrant pain: Secondary | ICD-10-CM | POA: Diagnosis not present

## 2024-04-24 DIAGNOSIS — R4189 Other symptoms and signs involving cognitive functions and awareness: Secondary | ICD-10-CM

## 2024-04-24 DIAGNOSIS — G8929 Other chronic pain: Secondary | ICD-10-CM | POA: Diagnosis not present

## 2024-04-24 DIAGNOSIS — E7800 Pure hypercholesterolemia, unspecified: Secondary | ICD-10-CM | POA: Diagnosis not present

## 2024-04-24 DIAGNOSIS — I10 Essential (primary) hypertension: Secondary | ICD-10-CM | POA: Diagnosis not present

## 2024-04-24 DIAGNOSIS — R1033 Periumbilical pain: Secondary | ICD-10-CM | POA: Diagnosis not present

## 2024-04-24 NOTE — Progress Notes (Signed)
 Amber Reynolds - 78 y.o. female MRN [id number]  Date of birth: September 19, 1945  Subjective Chief Complaint  Patient presents with   Hypertension   Abdominal Pain    HPI Amber Reynolds is a 78 y.o. female here today for follow up visit.    She reports that she is doing ok .  Seen by neurology a few months ago due to noting some cognitive decline.  MRI was relatively unremarkable.  No medications at this time.  Has follow up with neuro next month  She remains on amlodipine  for management of HTN.  BP Is mildly elevated.  She has not had significant side effects from this at current strength.  She denies chest pain, shortness of breath, palpitations, headache or vision changes.    She has had some central abdominal pain that occurs off and on.  Ultrasound last year due to gallstones without cholecystitis.  Adenomyomatosis of the gallbladder was noted.  She has to alternate between heat and ice to get relief.  Pain does not change with intake of food.    ROS:  A comprehensive ROS was completed and negative except as noted per HPI  No Known Allergies  Past Medical History:  Diagnosis Date   Arthritis 2021   Encounter for Medicare annual wellness exam 10/05/2023   History of colon polyps 09/20/2018   Hypertension     Past Surgical History:  Procedure Laterality Date   JOINT REPLACEMENT  07/29/2021   Right Hip   TUBAL LIGATION  1978    Social History   Socioeconomic History   Marital status: Married    Spouse name: [name]   Number of children: 3   Years of education: 15   Highest education level: Associate degree: occupational, Scientist, product/process development, or vocational program  Occupational History   Occupation: Education officer, community- [employer]    Comment: retired  Tobacco Use   Smoking status: Never   Smokeless tobacco: Never  Vaping Use   Vaping status: Never Used  Substance and Sexual Activity   Alcohol use: Not Currently   Drug use: Not Currently   Sexual activity: Not Currently   Other Topics Concern   Not on file  Social History Narrative   Works out 3-4 times a week. Reads a lot. Watches TV.    Pt lives with husband    Retired    Teacher, early years/pre Strain: Low Risk  (04/20/2024)   Overall Financial Resource Strain (CARDIA)    Difficulty of Paying Living Expenses: Not hard at all  Food Insecurity: No Food Insecurity (04/20/2024)   Hunger Vital Sign    Worried About Running Out of Food in the Last Year: Never true    Ran Out of Food in the Last Year: Never true  Transportation Needs: No Transportation Needs (04/20/2024)   PRAPARE - Administrator, Civil Service (Medical): No    Lack of Transportation (Non-Medical): No  Physical Activity: Insufficiently Active (04/20/2024)   Exercise Vital Sign    Days of Exercise per Week: 2 days    Minutes of Exercise per Session: 30 min  Stress: No Stress Concern Present (04/20/2024)   [employer] of Occupational Health - Occupational Stress Questionnaire    Feeling of Stress: Not at all  Social Connections: Socially Isolated (04/20/2024)   Social Connection and Isolation Panel    Frequency of Communication with Friends and Family: Once a week    Frequency of Social Gatherings with Friends and  Family: Once a week    Attends Religious Services: Never    Active Member of Clubs or Organizations: No    Attends Engineer, structural: Not on file    Marital Status: Married    Family History  Problem Relation Age of Onset   Heart attack Mother    Diabetes Son    Alzheimer's disease Neg Hx    Dementia Neg Hx     Health Maintenance  Topic Date Due   COVID-19 Vaccine (8 - 2024-25 season) 07/13/2023   INFLUENZA VACCINE  12/11/2024 (Originally 04/13/2024)   Medicare Annual Wellness (AWV)  10/04/2024   DTaP/Tdap/Td (2 - Td or Tdap) 07/08/2031   Pneumococcal Vaccine: 50+ Years  Completed   DEXA SCAN  Completed   Hepatitis C Screening  Completed   Zoster Vaccines- Shingrix   Completed   Hepatitis B Vaccines  Aged Out   HPV VACCINES  Aged Out   Meningococcal B Vaccine  Aged Out     ----------------------------------------------------------------------------------------------------------------------------------------------------------------------------------------------------------------- Physical Exam BP (!) 146/79   Pulse 72   Resp 20   Ht 5' 1 (1.549 m)   Wt 151 lb (68.5 kg)   SpO2 98%   BMI 28.53 kg/m   Physical Exam Constitutional:      Appearance: She is well-developed.  Eyes:     General: No scleral icterus. Cardiovascular:     Rate and Rhythm: Normal rate and regular rhythm.  Pulmonary:     Effort: Pulmonary effort is normal.     Breath sounds: Normal breath sounds.  Musculoskeletal:     Cervical back: Neck supple.  Neurological:     Mental Status: She is alert.  Psychiatric:        Mood and Affect: Mood normal.        Behavior: Behavior normal.     ------------------------------------------------------------------------------------------------------------------------------------------------------------------------------------------------------------------- Assessment and Plan  Essential hypertension BP elevated today.  Monitor BP at home and send readings after a few weeks.  Can consider addition of hydrochlorothiazide if bp remains elevated.   Cognitive change Stable.  Has upcoming follow-up with neurology.  LUQ pain Previous ultrasound with adenomyomatosis of the gallbladder which could be contributing to her pain but pain seems to be more periumbilical and left upper quadrant.  CT scan of the abdomen and pelvis ordered.   No orders of the defined types were placed in this encounter.   Return in about 6 months (around 10/25/2024) for Hypertension.

## 2024-04-24 NOTE — Assessment & Plan Note (Signed)
 BP elevated today.  Monitor BP at home and send readings after a few weeks.  Can consider addition of hydrochlorothiazide if bp remains elevated.

## 2024-04-24 NOTE — Assessment & Plan Note (Signed)
 Stable.  Has upcoming follow-up with neurology.

## 2024-04-24 NOTE — Assessment & Plan Note (Signed)
 Previous ultrasound with adenomyomatosis of the gallbladder which could be contributing to her pain but pain seems to be more periumbilical and left upper quadrant.  CT scan of the abdomen and pelvis ordered.

## 2024-04-25 LAB — LIPID PANEL WITH LDL/HDL RATIO
Cholesterol, Total: 250 mg/dL — ABNORMAL HIGH (ref 100–199)
HDL: 56 mg/dL (ref >39)
LDL Chol Calc (NIH): 176 mg/dL — ABNORMAL HIGH (ref 0–99)
LDL/HDL Ratio: 3.1 ratio (ref 0.0–3.2)
Triglycerides: 100 mg/dL (ref 0–149)
VLDL Cholesterol Cal: 18 mg/dL (ref 5–40)

## 2024-04-25 LAB — CMP14+EGFR
ALT: 22 IU/L (ref 0–32)
AST: 20 IU/L (ref 0–40)
Albumin: 4.4 g/dL (ref 3.8–4.8)
Alkaline Phosphatase: 95 IU/L (ref 44–121)
BUN/Creatinine Ratio: 21 (ref 12–28)
BUN: 15 mg/dL (ref 8–27)
Bilirubin Total: 0.5 mg/dL (ref 0.0–1.2)
CO2: 19 mmol/L — ABNORMAL LOW (ref 20–29)
Calcium: 9.4 mg/dL (ref 8.7–10.3)
Chloride: 107 mmol/L — ABNORMAL HIGH (ref 96–106)
Creatinine, Ser: 0.72 mg/dL (ref 0.57–1.00)
Globulin, Total: 2.2 g/dL (ref 1.5–4.5)
Glucose: 88 mg/dL (ref 70–99)
Potassium: 4.4 mmol/L (ref 3.5–5.2)
Sodium: 142 mmol/L (ref 134–144)
Total Protein: 6.6 g/dL (ref 6.0–8.5)
eGFR: 86 mL/min/1.73 (ref >59)

## 2024-04-25 LAB — CBC WITH DIFFERENTIAL/PLATELET
Basophils Absolute: 0.1 x10E3/uL (ref 0.0–0.2)
Basos: 1 %
EOS (ABSOLUTE): 0.2 x10E3/uL (ref 0.0–0.4)
Eos: 3 %
Hematocrit: 48.6 % — ABNORMAL HIGH (ref 34.0–46.6)
Hemoglobin: 15.7 g/dL (ref 11.1–15.9)
Immature Grans (Abs): 0 x10E3/uL (ref 0.0–0.1)
Immature Granulocytes: 0 %
Lymphocytes Absolute: 2 x10E3/uL (ref 0.7–3.1)
Lymphs: 31 %
MCH: 30.1 pg (ref 26.6–33.0)
MCHC: 32.3 g/dL (ref 31.5–35.7)
MCV: 93 fL (ref 79–97)
Monocytes Absolute: 0.6 x10E3/uL (ref 0.1–0.9)
Monocytes: 9 %
Neutrophils Absolute: 3.8 x10E3/uL (ref 1.4–7.0)
Neutrophils: 56 %
Platelets: 315 x10E3/uL (ref 150–450)
RBC: 5.21 x10E6/uL (ref 3.77–5.28)
RDW: 12.9 % (ref 11.7–15.4)
WBC: 6.7 x10E3/uL (ref 3.4–10.8)

## 2024-04-25 LAB — HEMOGLOBIN A1C
Est. average glucose Bld gHb Est-mCnc: 103 mg/dL
Hgb A1c MFr Bld: 5.2 % (ref 4.8–5.6)

## 2024-04-27 ENCOUNTER — Ambulatory Visit: Payer: Self-pay | Admitting: Family Medicine

## 2024-04-30 ENCOUNTER — Ambulatory Visit (INDEPENDENT_AMBULATORY_CARE_PROVIDER_SITE_OTHER)

## 2024-04-30 ENCOUNTER — Ambulatory Visit: Admitting: Adult Health

## 2024-04-30 DIAGNOSIS — K8020 Calculus of gallbladder without cholecystitis without obstruction: Secondary | ICD-10-CM | POA: Diagnosis not present

## 2024-04-30 DIAGNOSIS — G8929 Other chronic pain: Secondary | ICD-10-CM

## 2024-04-30 DIAGNOSIS — D259 Leiomyoma of uterus, unspecified: Secondary | ICD-10-CM | POA: Diagnosis not present

## 2024-04-30 DIAGNOSIS — R1033 Periumbilical pain: Secondary | ICD-10-CM | POA: Diagnosis not present

## 2024-04-30 DIAGNOSIS — N8110 Cystocele, unspecified: Secondary | ICD-10-CM | POA: Diagnosis not present

## 2024-04-30 MED ORDER — IOHEXOL 300 MG/ML  SOLN
100.0000 mL | Freq: Once | INTRAMUSCULAR | Status: AC | PRN
  Administered 2024-04-30: 100 mL via INTRAVENOUS

## 2024-05-10 NOTE — Progress Notes (Signed)
 [facility] [address] [place]. [place] [address] [phone]       OFFICE FOLLOW UP NOTE  [name] Date of Birth:  10-17-1945 Medical Record Number:  [id number]    Primary neurologist: Dr. Chalice Reason for visit: Neurocognitive disorder    SUBJECTIVE:  CHIEF COMPLAINT:  Chief Complaint  Patient presents with   Follow-up    Pt with husband, rm 8. Here for a follow up. No issues or concerns. Overall feels memory is about the same    Follow-up visit:   Brief HPI:   Amber Reynolds is a 78 y.o. female who was evaluated by Dr. Chalice in 11/2023 for memory concerns after completing Medicare AWV with elevated 6-CIT.  She denied any significant memory concerns, husband noting some mild short-term memory change but nothing significant.  MMSE 16/30.  MRI brain 11/2023 chronic small vessel ischemic changes.  ATN profile not consistent with Alzheimer's disease, other labs for reversible causes benign.     Interval history:  Patient returns for follow-up visit accompanied by her husband.  Reports overall doing well since prior visit and denies any worsening or progression of memory.  MMSE today 22/30, previously 16/30.  Continues to maintain ADLs independently.  She is able to maintain some IADLs but husband also assists with this.  She no longer drives due to previously getting lost while driving.  Reports she sleeps well and has a good appetite.  She does enjoy reading and tries to keep physically active.  Blood pressure today elevated with some improvement on recheck, does admit to forgetting morning antihypertensives, routinely monitors at home typically stable.  Reports recent lipid panel by PCP showed elevated cholesterol and was recommended by PCP to start statin but she has not yet decided if she wants to start this.     ROS:   14 system review of systems performed and negative with exception of those listed in HPI  PMH:  Past Medical  History:  Diagnosis Date   Arthritis 2021   Encounter for Medicare annual wellness exam 10/05/2023   History of colon polyps 09/20/2018   Hypertension     PSH:  Past Surgical History:  Procedure Laterality Date   JOINT REPLACEMENT  07/29/2021   Right Hip   TUBAL LIGATION  1978    Social History:  Social History   Socioeconomic History   Marital status: Married    Spouse name: [name]   Number of children: 3   Years of education: 15   Highest education level: Tax adviser degree: occupational, Scientist, product/process development, or vocational program  Occupational History   Occupation: Education officer, 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 Reads a lot. Watches TV.    Pt lives with husband    Retired    Teacher, early years/pre Strain: Low Risk  (04/20/2024)   Overall Financial Resource Strain (CARDIA)    Difficulty of Paying Living Expenses: Not hard at all  Food Insecurity: No Food Insecurity (04/20/2024)   Hunger Vital Sign    Worried About Running Out of Food in the Last Year:  Never true    Ran Out of Food in the Last Year: Never true  Transportation Needs: No Transportation Needs (04/20/2024)   PRAPARE - Administrator, Civil Service (Medical): No    Lack of Transportation (Non-Medical): No  Physical Activity: Insufficiently Active (04/20/2024)   Exercise Vital Sign    Days of Exercise per Week: 2 days    Minutes of Exercise per Session: 30 min  Stress: No Stress Concern Present (04/20/2024)   [employer] of Occupational Health - Occupational Stress Questionnaire    Feeling of Stress: Not at all  Social Connections: Socially Isolated (04/20/2024)    Social Connection and Isolation Panel    Frequency of Communication with Friends and Family: Once a week    Frequency of Social Gatherings with Friends and Family: Once a week    Attends Religious Services: Never    Database administrator or Organizations: No    Attends Engineer, structural: Not on file    Marital Status: Married  Catering manager Violence: Not At Risk (10/05/2023)   Humiliation, Afraid, Rape, and Kick questionnaire    Fear of Current or Ex-Partner: No    Emotionally Abused: No    Physically Abused: No    Sexually Abused: No    Family History:  Family History  Problem Relation Age of Onset   Heart attack Mother    Diabetes Son    Alzheimer's disease Neg Hx    Dementia Neg Hx     Medications:   Current Outpatient Medications on File Prior to Visit  Medication Sig Dispense Refill   amLODipine  (NORVASC ) 10 MG tablet TAKE 1 TABLET BY MOUTH EVERY DAY FOR BLOOD PRESSURE 90 tablet 3   Multiple Vitamin (MULTIVITAMIN) tablet Take 1 tablet by mouth daily.     Probiotic Product (PROBIOTIC-10 ULTIMATE PO) Take by mouth.     No current facility-administered medications on file prior to visit.    Allergies:  No Known Allergies    OBJECTIVE:  Physical Exam  Vitals:   05/15/24 1318 05/15/24 1348  BP: (!) 170/84 (!) 158/80  Pulse: 73   Weight: 152 lb (68.9 kg)   Height: 5' (1.524 m)    Body mass index is 29.69 kg/m. No results found.   General: well developed, well nourished, very pleasant elderly Caucasian female, seated, in no evident distress Head: head normocephalic and atraumatic.   Neck: supple with no carotid or supraclavicular bruits Cardiovascular: regular rate and rhythm, no murmurs Musculoskeletal: no deformity Skin:  no rash/petichiae Vascular:  Normal pulses all extremities   Neurologic Exam Mental Status: Awake and fully alert. Oriented to place and time. Recent and remote memory intact. Attention span, concentration and fund of  knowledge appropriate. Mood and affect appropriate.  Cranial Nerves: Pupils equal, briskly reactive to light. Extraocular movements full without nystagmus. Visual fields full to confrontation. Hearing intact. Facial sensation intact. Face, tongue, palate moves normally and symmetrically.  Motor: Normal bulk and tone. Normal strength in all tested extremity muscles Sensory.: intact to touch , pinprick , position and vibratory sensation.  Coordination: Rapid alternating movements normal in all extremities. Finger-to-nose and heel-to-shin performed accurately bilaterally. Gait and Station: Arises from chair without difficulty. Stance is normal. Gait demonstrates normal stride length and balance without use of AD. Reflexes: 1+ and symmetric. Toes downgoing.      05/15/2024    1:23 PM 11/16/2023    3:46 PM  MMSE - Mini Mental State Exam  Orientation to  time 1 2  Orientation to Place 4 3  Registration 3 3  Attention/ Calculation 5 0  Recall 0 1  Language- name 2 objects 2 2  Language- repeat 1 0  Language- follow 3 step command 3 3  Language- read & follow direction 1 1  Write a sentence 1 1  Copy design 1 0  Total score 22 16         ASSESSMENT/PLAN: Amber Reynolds is a 78 y.o. year old female      Mild cognitive impairment:  MMSE 22/30 (prior 16/30) Suspect vascular component as well as age related. Discussed importance of managing blood pressure and cholesterol. Do agree with PCP in regards to initiating statin therapy, after further discussion today, she does agree to start and will reach out to PCP to advise him of this.  Discussed importance of routine physical and cognitive exercises as well as ensuring good sleep, healthy diet and routine socialization PCP can consider use of memantine or cholinesterase inhibitors in the future if needed to help slow decline but as cognition currently stable, will hold off at this time.  Will check B12 level today as prior level borderline  low MR brain 11/2023 chronic small vessel disease ATN profile not consistent with Alzheimer's disease Lab work for reversible causes benign     Can return back to PCP for ongoing monitoring management     CC:  PCP: Alvia Bring, DO    I personally spent a total of 30 minutes in the care of the patient today including preparing to see the patient, performing a medically appropriate exam/evaluation, counseling and educating, placing orders, and documenting clinical information in the EHR.  Harlene Bogaert, AGNP-BC  [facility] [address] [place], [place] [address]  Phone [phone] Fax [phone] Note: This document was prepared with digital dictation and possible smart phrase technology. Any transcriptional errors that result from this process are unintentional.

## 2024-05-15 ENCOUNTER — Ambulatory Visit (INDEPENDENT_AMBULATORY_CARE_PROVIDER_SITE_OTHER): Admitting: Adult Health

## 2024-05-15 ENCOUNTER — Encounter: Payer: Self-pay | Admitting: Adult Health

## 2024-05-15 ENCOUNTER — Encounter: Payer: Self-pay | Admitting: Sports Medicine

## 2024-05-15 VITALS — BP 158/80 | HR 73 | Ht 60.0 in | Wt 152.0 lb

## 2024-05-15 DIAGNOSIS — R419 Unspecified symptoms and signs involving cognitive functions and awareness: Secondary | ICD-10-CM

## 2024-05-15 DIAGNOSIS — E538 Deficiency of other specified B group vitamins: Secondary | ICD-10-CM | POA: Diagnosis not present

## 2024-05-15 NOTE — Patient Instructions (Addendum)
 Your Plan:  Continue routine physical exercises and ensure routine cognitive exercises as well as good sleep, healthy diet and routine socialization   Continue to follow closely with Dr. Alvia for management of blood pressure and cholesterol. If these remain uncontrolled, you will likely continue to see gradual decline of your memory. I do believe starting on a statin medication would be beneficial   We will check b12 level today - I will notify you via mychart regarding these results      Follow up as needed at this time      Thank you for coming to see us  at [facility]. I hope we have been able to provide you high quality care today.  You may receive a patient satisfaction survey over the next few weeks. We would appreciate your feedback and comments so that we may continue to improve ourselves and the health of our patients.

## 2024-05-16 ENCOUNTER — Ambulatory Visit: Payer: Self-pay | Admitting: Adult Health

## 2024-05-16 LAB — VITAMIN B12: Vitamin B-12: 416 pg/mL (ref 232–1245)

## 2024-06-14 DIAGNOSIS — Z23 Encounter for immunization: Secondary | ICD-10-CM | POA: Diagnosis not present

## 2024-07-09 ENCOUNTER — Ambulatory Visit (INDEPENDENT_AMBULATORY_CARE_PROVIDER_SITE_OTHER): Admitting: Family Medicine

## 2024-07-09 ENCOUNTER — Encounter: Payer: Self-pay | Admitting: Family Medicine

## 2024-07-09 VITALS — BP 156/72 | HR 66 | Ht 60.0 in | Wt 150.0 lb

## 2024-07-09 DIAGNOSIS — M7912 Myalgia of auxiliary muscles, head and neck: Secondary | ICD-10-CM | POA: Insufficient documentation

## 2024-07-09 NOTE — Progress Notes (Signed)
 Amber Reynolds - 78 y.o. female MRN [id number]  Date of birth: 12-04-1945  Subjective Chief Complaint  Patient presents with   Neck Pain    HPI Amber Reynolds is a 78 y.o. female here today with complaint of neck pain.    Her symptoms began a couple of weeks ago.  Doesn't recall any injury.  She did have a dental appt for new upper dentures about the time this started.  Pain radiates from the L ear and into the neck area.  Pain worse with looking towards right side.  No changes to hearing.  No dizziness or headache.  No drainage from the ear.    ROS:  A comprehensive ROS was completed and negative except as noted per HPI  No Known Allergies  Past Medical History:  Diagnosis Date   Arthritis 2021   Encounter for Medicare annual wellness exam 10/05/2023   History of colon polyps 09/20/2018   Hypertension     Past Surgical History:  Procedure Laterality Date   JOINT REPLACEMENT  07/29/2021   Right Hip   TUBAL LIGATION  1978    Social History   Socioeconomic History   Marital status: Married    Spouse name: [name]   Number of children: 3   Years of education: 15   Highest education level: Associate degree: occupational, scientist, product/process development, or vocational program  Occupational History   Occupation: education officer, community- [employer]    Comment: retired  Tobacco Use   Smoking status: Never   Smokeless tobacco: Never  Vaping Use   Vaping status: Never Used  Substance and Sexual Activity   Alcohol use: Not Currently   Drug use: Not Currently   Sexual activity: Not Currently  Other Topics Concern   Not on file  Social History Narrative   Works out 3-4 times a week. Reads a lot. Watches TV.    Pt lives with husband    Retired    Teacher, Early Years/pre Strain: Low Risk  (07/06/2024)   Overall Financial Resource Strain (CARDIA)    Difficulty of Paying Living Expenses: Not hard at all  Food Insecurity: No Food Insecurity (07/06/2024)   Hunger Vital Sign     Worried About Running Out of Food in the Last Year: Never true    Ran Out of Food in the Last Year: Never true  Transportation Needs: No Transportation Needs (07/06/2024)   PRAPARE - Administrator, Civil Service (Medical): No    Lack of Transportation (Non-Medical): No  Physical Activity: Insufficiently Active (07/06/2024)   Exercise Vital Sign    Days of Exercise per Week: 2 days    Minutes of Exercise per Session: 30 min  Stress: No Stress Concern Present (07/06/2024)   [employer] of Occupational Health - Occupational Stress Questionnaire    Feeling of Stress: Not at all  Social Connections: Moderately Isolated (07/06/2024)   Social Connection and Isolation Panel    Frequency of Communication with Friends and Family: Three times a week    Frequency of Social Gatherings with Friends and Family: Once a week    Attends Religious Services: Never    Database Administrator or Organizations: No    Attends Engineer, Structural: Not on file    Marital Status: Married    Family History  Problem Relation Age of Onset   Heart attack Mother    Diabetes Son    Alzheimer's disease Neg Hx  Dementia Neg Hx     Health Maintenance  Topic Date Due   Medicare Annual Wellness (AWV)  10/04/2024   COVID-19 Vaccine (9 - 2025-26 season) 12/13/2024   DTaP/Tdap/Td (2 - Td or Tdap) 07/08/2031   Pneumococcal Vaccine: 50+ Years  Completed   Influenza Vaccine  Completed   DEXA SCAN  Completed   Hepatitis C Screening  Completed   Zoster Vaccines- Shingrix  Completed   Meningococcal B Vaccine  Aged Out   Mammogram  Discontinued     ----------------------------------------------------------------------------------------------------------------------------------------------------------------------------------------------------------------- Physical Exam BP (!) 160/70 (BP Location: Left Arm, Patient Position: Sitting, Cuff Size: Normal)   Pulse 66   Ht 5' (1.524 m)    Wt 150 lb (68 kg)   SpO2 98%   BMI 29.29 kg/m   Physical Exam Constitutional:      Appearance: Normal appearance.  HENT:     Head: Normocephalic and atraumatic.     Right Ear: Tympanic membrane normal.     Ears:     Comments: Small amount of serous fluid noted.  Eyes:     General: No scleral icterus. Neck:     Comments: TTP along L SCM Cardiovascular:     Rate and Rhythm: Normal rate and regular rhythm.  Pulmonary:     Effort: Pulmonary effort is normal.     Breath sounds: Normal breath sounds.  Musculoskeletal:     Cervical back: Neck supple.  Skin:    General: Skin is warm and dry.  Neurological:     Mental Status: She is alert.  Psychiatric:        Mood and Affect: Mood normal.        Behavior: Behavior normal.     ------------------------------------------------------------------------------------------------------------------------------------------------------------------------------------------------------------------- Assessment and Plan  Sternocleidomastoid muscle tenderness May continue tylenol as needed.  Heat may be helpful as well.  Given handout for home exercises.  Red flags reviewed.  F/u if not improving.    No orders of the defined types were placed in this encounter.   No follow-ups on file.

## 2024-07-09 NOTE — Assessment & Plan Note (Signed)
 May continue tylenol as needed.  Heat may be helpful as well.  Given handout for home exercises.  Red flags reviewed.  F/u if not improving.

## 2024-08-16 ENCOUNTER — Other Ambulatory Visit: Payer: Self-pay | Admitting: Family Medicine

## 2024-08-16 MED ORDER — AMLODIPINE BESYLATE 10 MG PO TABS: ORAL_TABLET | ORAL | 3 refills | Status: AC

## 2024-10-09 ENCOUNTER — Ambulatory Visit

## 2024-10-09 ENCOUNTER — Telehealth: Payer: Self-pay

## 2024-10-09 NOTE — Telephone Encounter (Signed)
 Patient scheduled.

## 2024-10-09 NOTE — Telephone Encounter (Signed)
 Copied from CRM #[id number]. Topic: Appointments - Appointment Scheduling >> Oct 09, 2024 12:35 PM Antony RAMAN wrote: Trying to reschedule her awv, when I try it only gives dates in 2027 Please advise [phone]

## 2024-10-11 ENCOUNTER — Ambulatory Visit

## 2024-10-11 VITALS — BP 153/84 | HR 88 | Ht 60.0 in | Wt 150.0 lb

## 2024-10-11 DIAGNOSIS — Z0000 Encounter for general adult medical examination without abnormal findings: Secondary | ICD-10-CM

## 2024-10-11 NOTE — Progress Notes (Signed)
 "  Chief Complaint  Patient presents with   Medicare Wellness     Subjective:   Amber Reynolds is a 79 y.o. female who presents for a Medicare Annual Wellness Visit.  Visit info / Clinical Intake: Medicare Wellness Visit Type:: Subsequent Annual Wellness Visit Persons participating in visit and providing information:: patient Medicare Wellness Visit Mode:: In-person (required for WTM) Interpreter Needed?: No Pre-visit prep was completed: yes AWV questionnaire completed by patient prior to visit?: yes Date:: 10/10/24 Living arrangements:: lives with spouse/significant other Patient's Overall Health Status Rating: excellent Typical amount of pain: none Does pain affect daily life?: no Are you currently prescribed opioids?: no  Dietary Habits and Nutritional Risks How many meals a day?: 3 Eats fruit and vegetables daily?: yes Most meals are obtained by: preparing own meals; eating out In the last 2 weeks, have you had any of the following?: none Diabetic:: no  Functional Status Activities of Daily Living (to include ambulation/medication): Independent Ambulation: Independent Medication Administration: Independent Home Management (perform basic housework or laundry): Independent Manage your own finances?: yes Primary transportation is: driving; family / friends Concerns about vision?: no *vision screening is required for WTM* Concerns about hearing?: no  Fall Screening Falls in the past year?: 0 Number of falls in past year: 0 Was there an injury with Fall?: 0 Fall Risk Category Calculator: 0 Patient Fall Risk Level: Low Fall Risk  Fall Risk Patient at Risk for Falls Due to: No Fall Risks Fall risk Follow up: Falls evaluation completed  Home and Transportation Safety: All rugs have non-skid backing?: yes All stairs or steps have railings?: yes Grab bars in the bathtub or shower?: (!) no Have non-skid surface in bathtub or shower?: yes Good home lighting?:  yes Regular seat belt use?: yes Hospital stays in the last year:: no  Cognitive Assessment Difficulty concentrating, remembering, or making decisions? : no Will 6CIT or Mini Cog be Completed: yes What year is it?: 4 points What month is it?: 3 points Give patient an address phrase to remember (5 components): [address] [place], [place] About what time is it?: 0 points Count backwards from 20 to 1: 0 points Say the months of the year in reverse: 4 points Repeat the address phrase from earlier: 8 points 6 CIT Score: 19 points  Advance Directives (For Healthcare) Does Patient Have a Medical Advance Directive?: Yes Does patient want to make changes to medical advance directive?: No - Patient declined Type of Advance Directive: Healthcare Power of Union; Living will  Reviewed/Updated  Reviewed/Updated: Medical History; Surgical History; Medications; Allergies; Care Teams; Patient Goals    Allergies (verified) Patient has no known allergies.   Current Medications (verified) Outpatient Encounter Medications as of 10/11/2024  Medication Sig   amLODipine  (NORVASC ) 10 MG tablet TAKE 1 TABLET BY MOUTH EVERY DAY FOR BLOOD PRESSURE   Multiple Vitamin (MULTIVITAMIN) tablet Take 1 tablet by mouth daily.   Probiotic Product (PROBIOTIC-10 ULTIMATE PO) Take by mouth.   No facility-administered encounter medications on file as of 10/11/2024.    History: Past Medical History:  Diagnosis Date   Arthritis 2021   Encounter for Medicare annual wellness exam 10/05/2023   History of colon polyps 09/20/2018   Hypertension    Past Surgical History:  Procedure Laterality Date   JOINT REPLACEMENT  07/29/2021   Right Hip   TUBAL LIGATION  1978   Family History  Problem Relation Age of Onset   Heart attack Mother    Diabetes Son  Alzheimer's disease Neg Hx    Dementia Neg Hx    Social History   Occupational History   Occupation: education officer, community- [employer]    Comment: retired   Tobacco Use   Smoking status: Never   Smokeless tobacco: Never  Vaping Use   Vaping status: Never Used  Substance and Sexual Activity   Alcohol use: Not Currently   Drug use: Not Currently   Sexual activity: Not Currently   Tobacco Counseling Counseling given: Not Answered  SDOH Screenings   Food Insecurity: No Food Insecurity (10/11/2024)  Housing: Low Risk (10/11/2024)  Transportation Needs: No Transportation Needs (10/11/2024)  Utilities: Not At Risk (10/11/2024)  Alcohol Screen: Low Risk (07/06/2024)  Depression (PHQ2-9): Low Risk (10/11/2024)  Financial Resource Strain: Low Risk (07/06/2024)  Physical Activity: Insufficiently Active (10/11/2024)  Social Connections: Moderately Isolated (10/11/2024)  Stress: No Stress Concern Present (10/11/2024)  Tobacco Use: Low Risk (10/11/2024)  Health Literacy: Adequate Health Literacy (10/11/2024)   See flowsheets for full screening details  Depression Screen PHQ 2 & 9 Depression Scale- Over the past 2 weeks, how often have you been bothered by any of the following problems? Little interest or pleasure in doing things: 0 Feeling down, depressed, or hopeless (PHQ Adolescent also includes...irritable): 0 PHQ-2 Total Score: 0     Goals Addressed             This Visit's Progress    Patient Stated       Patient states she would like to read more and watch less TV.             Objective:    Today's Vitals   10/11/24 0843 10/11/24 0855  BP: (!) 154/70 (!) 153/84  Pulse: 88   SpO2: 99%   Weight: 150 lb (68 kg)   Height: 5' (1.524 m)    Body mass index is 29.29 kg/m.  Hearing/Vision screen No results found. Immunizations and Health Maintenance Health Maintenance  Topic Date Due   COVID-19 Vaccine (9 - 2025-26 season) 12/13/2024   Medicare Annual Wellness (AWV)  10/11/2025   DTaP/Tdap/Td (2 - Td or Tdap) 07/08/2031   Pneumococcal Vaccine: 50+ Years  Completed   Influenza Vaccine  Completed   Bone Density Scan   Completed   Hepatitis C Screening  Completed   Zoster Vaccines- Shingrix  Completed   Meningococcal B Vaccine  Aged Out   Mammogram  Discontinued        Assessment/Plan:  This is a routine wellness examination for Amber Reynolds.  Patient Care Team: Alvia Bring, DO as PCP - General (Family Medicine) Whitfield Raisin, NP as Nurse Practitioner (Neurology)  I have personally reviewed and noted the following in the patients chart:   Medical and social history Use of alcohol, tobacco or illicit drugs  Current medications and supplements including opioid prescriptions. Functional ability and status Nutritional status Physical activity Advanced directives List of other physicians Hospitalizations, surgeries, and ER visits in previous 12 months Vitals Screenings to include cognitive, depression, and falls Referrals and appointments  No orders of the defined types were placed in this encounter.  In addition, I have reviewed and discussed with patient certain preventive protocols, quality metrics, and best practice recommendations. A written personalized care plan for preventive services as well as general preventive health recommendations were provided to patient.   Amber Reynolds, CMA   10/11/2024   Return in 1 year (on 10/11/2025).  After Visit Summary: (In Person-Declined) Patient declined AVS at this time.  Nurse Notes:   Amber Reynolds is a 79 y.o. female patient of Velma Ku, DO who had a Medicare Annual Wellness Visit today. Amber Reynolds lives with her spouse. She reports that she is socially active and does interact with friends/family regularly. She is moderately physically active and enjoys reading.   "

## 2024-10-11 NOTE — Patient Instructions (Signed)
 Ms. Fonner,  Thank you for taking the time for your Medicare Wellness Visit. I appreciate your continued commitment to your health goals. Please review the care plan we discussed, and feel free to reach out if I can assist you further.  Please note that Annual Wellness Visits do not include a physical exam. Some assessments may be limited, especially if the visit was conducted virtually. If needed, we may recommend an in-person follow-up with your provider.  Ongoing Care Seeing your primary care provider every 3 to 6 months helps us  monitor your health and provide consistent, personalized care.   Referrals If a referral was made during today's visit and you haven't received any updates within two weeks, please contact the referred provider directly to check on the status.  Recommended Screenings:  Health Maintenance  Topic Date Due   Medicare Annual Wellness Visit  10/04/2024   COVID-19 Vaccine (9 - 2025-26 season) 12/13/2024   DTaP/Tdap/Td vaccine (2 - Td or Tdap) 07/08/2031   Pneumococcal Vaccine for age over 4  Completed   Flu Shot  Completed   Osteoporosis screening with Bone Density Scan  Completed   Hepatitis C Screening  Completed   Zoster (Shingles) Vaccine  Completed   Meningitis B Vaccine  Aged Out   Breast Cancer Screening  Discontinued       10/11/2024    8:48 AM  Advanced Directives  Does Patient Have a Medical Advance Directive? Yes  Type of Estate Agent of [place];Living will  Does patient want to make changes to medical advance directive? No - Patient declined    Vision: Annual vision screenings are recommended for early detection of glaucoma, cataracts, and diabetic retinopathy. These exams can also reveal signs of chronic conditions such as diabetes and high blood pressure.  Dental: Annual dental screenings help detect early signs of oral cancer, gum disease, and other conditions linked to overall health, including heart disease and  diabetes.  Please see the attached documents for additional preventive care recommendations.

## 2024-10-25 ENCOUNTER — Ambulatory Visit: Admitting: Family Medicine

## 2025-10-15 ENCOUNTER — Ambulatory Visit
